# Patient Record
Sex: Female | Born: 1945 | Race: Black or African American | Hispanic: No | Smoking: Former smoker
Health system: Southern US, Community
[De-identification: ages and names within clinical notes are randomized; demographics above are authoritative.]

## PROBLEM LIST (undated history)

## (undated) DIAGNOSIS — K219 Gastro-esophageal reflux disease without esophagitis: Secondary | ICD-10-CM

## (undated) DIAGNOSIS — T7840XA Allergy, unspecified, initial encounter: Secondary | ICD-10-CM

## (undated) DIAGNOSIS — E785 Hyperlipidemia, unspecified: Secondary | ICD-10-CM

## (undated) DIAGNOSIS — I10 Essential (primary) hypertension: Secondary | ICD-10-CM

## (undated) HISTORY — PX: COLONOSCOPY: SHX174

## (undated) HISTORY — DX: Hyperlipidemia, unspecified: E78.5

## (undated) HISTORY — DX: Gastro-esophageal reflux disease without esophagitis: K21.9

## (undated) HISTORY — DX: Allergy, unspecified, initial encounter: T78.40XA

## (undated) HISTORY — PX: ABDOMINAL HYSTERECTOMY: SHX81

---

## 2004-01-16 ENCOUNTER — Emergency Department (HOSPITAL_COMMUNITY): Admission: EM | Admit: 2004-01-16 | Discharge: 2004-01-16 | Payer: Self-pay | Admitting: Emergency Medicine

## 2004-04-15 ENCOUNTER — Ambulatory Visit: Payer: Self-pay | Admitting: Urology

## 2005-05-09 ENCOUNTER — Ambulatory Visit: Payer: Self-pay | Admitting: Family Medicine

## 2005-06-19 ENCOUNTER — Ambulatory Visit: Payer: Self-pay | Admitting: Family Medicine

## 2006-07-08 ENCOUNTER — Ambulatory Visit: Payer: Self-pay | Admitting: Family Medicine

## 2007-07-20 ENCOUNTER — Ambulatory Visit: Payer: Self-pay | Admitting: Family Medicine

## 2007-11-22 ENCOUNTER — Ambulatory Visit: Payer: Self-pay | Admitting: Family Medicine

## 2007-12-01 ENCOUNTER — Ambulatory Visit: Payer: Self-pay | Admitting: Gastroenterology

## 2007-12-02 ENCOUNTER — Ambulatory Visit: Payer: Self-pay | Admitting: Family Medicine

## 2008-04-17 ENCOUNTER — Ambulatory Visit: Payer: Self-pay | Admitting: Family Medicine

## 2009-05-16 ENCOUNTER — Ambulatory Visit: Payer: Self-pay | Admitting: Family Medicine

## 2009-07-09 ENCOUNTER — Ambulatory Visit: Payer: Self-pay | Admitting: Family Medicine

## 2010-11-19 ENCOUNTER — Ambulatory Visit: Payer: Self-pay | Admitting: Family Medicine

## 2011-06-14 ENCOUNTER — Emergency Department: Payer: Self-pay | Admitting: Emergency Medicine

## 2012-06-17 ENCOUNTER — Ambulatory Visit: Payer: Self-pay | Admitting: Family Medicine

## 2013-12-20 ENCOUNTER — Ambulatory Visit: Payer: Self-pay | Admitting: Family Medicine

## 2014-01-25 ENCOUNTER — Ambulatory Visit: Payer: Self-pay | Admitting: Family Medicine

## 2014-08-11 DIAGNOSIS — R0789 Other chest pain: Secondary | ICD-10-CM | POA: Diagnosis not present

## 2014-11-18 ENCOUNTER — Encounter: Payer: Self-pay | Admitting: Emergency Medicine

## 2014-11-18 ENCOUNTER — Emergency Department
Admission: EM | Admit: 2014-11-18 | Discharge: 2014-11-18 | Disposition: A | Discharge status: Home / Self Care | Payer: Medicare Other | Attending: Emergency Medicine | Admitting: Emergency Medicine

## 2014-11-18 DIAGNOSIS — H6503 Acute serous otitis media, bilateral: Secondary | ICD-10-CM

## 2014-11-18 DIAGNOSIS — R42 Dizziness and giddiness: Secondary | ICD-10-CM | POA: Diagnosis not present

## 2014-11-18 DIAGNOSIS — F17200 Nicotine dependence, unspecified, uncomplicated: Secondary | ICD-10-CM | POA: Diagnosis not present

## 2014-11-18 DIAGNOSIS — J018 Other acute sinusitis: Secondary | ICD-10-CM | POA: Diagnosis not present

## 2014-11-18 DIAGNOSIS — I10 Essential (primary) hypertension: Secondary | ICD-10-CM | POA: Insufficient documentation

## 2014-11-18 DIAGNOSIS — Z720 Tobacco use: Secondary | ICD-10-CM | POA: Insufficient documentation

## 2014-11-18 DIAGNOSIS — R51 Headache: Secondary | ICD-10-CM | POA: Diagnosis present

## 2014-11-18 HISTORY — DX: Essential (primary) hypertension: I10

## 2014-11-18 MED ORDER — MECLIZINE HCL 25 MG PO TABS
25.0000 mg | ORAL_TABLET | Freq: Once | ORAL | Status: AC
  Administered 2014-11-18: 25 mg via ORAL

## 2014-11-18 MED ORDER — MECLIZINE HCL 25 MG PO TABS
ORAL_TABLET | ORAL | Status: AC
  Administered 2014-11-18: 25 mg via ORAL
  Filled 2014-11-18: qty 1

## 2014-11-18 MED ORDER — PREDNISONE 20 MG PO TABS
ORAL_TABLET | ORAL | Status: AC
  Administered 2014-11-18: 60 mg via ORAL
  Filled 2014-11-18: qty 3

## 2014-11-18 MED ORDER — MECLIZINE HCL 25 MG PO TABS: 25.0000 mg | ORAL_TABLET | Freq: Three times a day (TID) | ORAL | Status: DC | PRN

## 2014-11-18 MED ORDER — AMOXICILLIN-POT CLAVULANATE 875-125 MG PO TABS: 1.0000 | ORAL_TABLET | Freq: Two times a day (BID) | ORAL | Status: AC

## 2014-11-18 MED ORDER — PREDNISONE 20 MG PO TABS
60.0000 mg | ORAL_TABLET | Freq: Once | ORAL | Status: AC
  Administered 2014-11-18: 60 mg via ORAL

## 2014-11-18 MED ORDER — FEXOFENADINE-PSEUDOEPHED ER 60-120 MG PO TB12: 1.0000 | ORAL_TABLET | Freq: Two times a day (BID) | ORAL | Status: DC

## 2014-11-18 MED ORDER — PREDNISONE 20 MG PO TABS: 40.0000 mg | ORAL_TABLET | Freq: Every day | ORAL | Status: DC

## 2014-11-18 MED ORDER — LORATADINE-PSEUDOEPHEDRINE ER 5-120 MG PO TB12: 1.0000 | ORAL_TABLET | Freq: Two times a day (BID) | ORAL | Status: DC

## 2014-11-18 NOTE — ED Provider Notes (Signed)
[facility] Emergency Department Provider Note    ____________________________________________  Time seen: 1706  I have reviewed the triage vital signs and the nursing notes.   HISTORY  Chief Complaint URI       HPI Donna Welch is a 69 y.o. female and is complaining of sinus pressure pain headachenasal drainage and some dizziness with quick positional changes over the last week symptoms started approximately 2 weeks ago rates her pain as about a 3-5 out of 10 throbbing type head pain when it's on currently rates it as about a 3 no nausea no vomiting no diarrhea no fevers or chills and no other signs or symptoms currently     Past Medical History  Diagnosis Date  . Hypertension     There are no active problems to display for this patient.   No past surgical history on file.  Current Outpatient Rx  Name  Route  Sig  Dispense  Refill  . amoxicillin-clavulanate (AUGMENTIN) 875-125 MG per tablet   Oral   Take 1 tablet by mouth 2 (two) times daily.   14 tablet   0   . loratadine-pseudoephedrine (CLARITIN-D 12 HOUR) 5-120 MG per tablet   Oral   Take 1 tablet by mouth 2 (two) times daily.   14 tablet   2   . meclizine (ANTIVERT) 25 MG tablet   Oral   Take 1 tablet (25 mg total) by mouth 3 (three) times daily as needed for dizziness.   15 tablet   0   . predniSONE (DELTASONE) 20 MG tablet   Oral   Take 2 tablets (40 mg total) by mouth daily.   8 tablet   0     Allergies Vicodin  No family history on file.  Social History History  Substance Use Topics  . Smoking status: Current Every Day Smoker -- 0.50 packs/day  . Smokeless tobacco: Not on file  . Alcohol Use: 8.4 oz/week    14 Standard drinks or equivalent per week    Review of Systems  Constitutional: Negative for fever. Eyes: Negative for visual changes. ENT: Negative for sore throat. Cardiovascular: Negative for chest pain. Respiratory: Negative for  shortness of breath. Gastrointestinal: Negative for abdominal pain, vomiting and diarrhea. Genitourinary: Negative for dysuria. Musculoskeletal: Negative for back pain. Skin: Negative for rash. Neurological: Negative for headaches, focal weakness or numbness.   10-point ROS otherwise negative.  ____________________________________________   PHYSICAL EXAM:  VITAL SIGNS: ED Triage Vitals  Enc Vitals Group     BP 11/18/14 1655 157/77 mmHg     Pulse Rate 11/18/14 1655 52     Resp 11/18/14 1655 18     Temp 11/18/14 1655 98.3 F (36.8 C)     Temp Source 11/18/14 1655 Oral     SpO2 11/18/14 1655 96 %     Weight 11/18/14 1655 164 lb (74.39 kg)     Height 11/18/14 1655 5\' 5"  (1.651 m)     Head Cir --      Peak Flow --      Pain Score 11/18/14 1657 0     Pain Loc --      Pain Edu? --      Excl. in GC? --      Constitutional: Alert and oriented. Well appearing and in no distress. Eyes: Conjunctivae are normal. PERRL. Normal extraocular movements. ENT   Head: Pain with percussion over frontal and maxillary sinuses bilateral serous otitis media   Nose: No congestion/rhinnorhea.  Mouth/Throat: Mucous membranes are moist.   Neck: No stridor. Hematological/Lymphatic/Immunilogical: No cervical lymphadenopathy. Cardiovascular: Normal rate, regular rhythm. Normal and symmetric distal pulses are present in all extremities. No murmurs, rubs, or gallops. Respiratory: Normal respiratory effort without tachypnea nor retractions. Breath sounds are clear and equal bilaterally. No wheezes/rales/rhonchi.  Musculoskeletal: Nontender with normal range of motion in all extremities. No joint effusions.  No lower extremity tenderness nor edema. Neurologic:  Normal speech and language. No gross focal neurologic deficits are appreciated. Speech is normal. No gait instability. Skin:  Skin is warm, dry and intact. No rash noted. Psychiatric: Mood and affect are normal. Speech and behavior  are normal. Patient exhibits appropriate insight and judgment.  ____________________________________________      PROCEDURES  Procedure(s) performed: None  Critical Care performed: No  ____________________________________________   INITIAL IMPRESSION / ASSESSMENT AND PLAN / ED COURSE  Pertinent labs & imaging results that were available during my care of the patient were reviewed by me and considered in my medical decision making (see chart for details).  Sinusitis otitis serous patient has an exam revealing fluid behind both ears pain with percussion over her frontal and maxillary sinuses and nasal drainage or patient on decongestants and antihistamines and have follow-up with ear nose and throat is symptoms persist  ____________________________________________   FINAL CLINICAL IMPRESSION(S) / ED DIAGNOSES  Final diagnoses:  Other acute sinusitis  Bilateral acute serous otitis media, recurrence not specified    Raiden Yearwood Rosalyn GessWilliam C Yehudit Fulginiti, PA-C 11/18/14 1744  Sharman CheekPhillip Stafford, MD 11/18/14 2327

## 2014-11-18 NOTE — ED Notes (Signed)
Voice raspy

## 2014-12-01 DIAGNOSIS — Z716 Tobacco abuse counseling: Secondary | ICD-10-CM | POA: Diagnosis not present

## 2014-12-01 DIAGNOSIS — H6593 Unspecified nonsuppurative otitis media, bilateral: Secondary | ICD-10-CM | POA: Diagnosis not present

## 2014-12-18 ENCOUNTER — Telehealth: Payer: Self-pay | Admitting: Family Medicine

## 2014-12-18 DIAGNOSIS — N3281 Overactive bladder: Secondary | ICD-10-CM

## 2014-12-18 NOTE — Telephone Encounter (Signed)
Sent refill for 2 months to pt pharmacy. Pt needs to schedule follow up appt for remaining refills.

## 2015-01-05 ENCOUNTER — Other Ambulatory Visit: Payer: Self-pay | Admitting: Family Medicine

## 2015-01-08 ENCOUNTER — Encounter: Payer: Self-pay | Admitting: Family Medicine

## 2015-01-08 ENCOUNTER — Ambulatory Visit (INDEPENDENT_AMBULATORY_CARE_PROVIDER_SITE_OTHER): Payer: 59 | Admitting: Family Medicine

## 2015-01-08 ENCOUNTER — Encounter (INDEPENDENT_AMBULATORY_CARE_PROVIDER_SITE_OTHER): Payer: Self-pay

## 2015-01-08 VITALS — BP 122/70 | HR 60 | Temp 98.3°F | Resp 16 | Ht 65.0 in | Wt 166.6 lb

## 2015-01-08 DIAGNOSIS — N3281 Overactive bladder: Secondary | ICD-10-CM

## 2015-01-08 DIAGNOSIS — I10 Essential (primary) hypertension: Secondary | ICD-10-CM

## 2015-01-08 DIAGNOSIS — E785 Hyperlipidemia, unspecified: Secondary | ICD-10-CM

## 2015-01-08 MED ORDER — OXYBUTYNIN CHLORIDE ER 10 MG PO TB24
10.0000 mg | ORAL_TABLET | Freq: Every day | ORAL | Status: AC
Start: 2015-01-08 — End: ?

## 2015-01-08 MED ORDER — OMEPRAZOLE 40 MG PO CPDR: 40.0000 mg | DELAYED_RELEASE_CAPSULE | Freq: Every day | ORAL | Status: DC

## 2015-01-08 MED ORDER — OXYBUTYNIN CHLORIDE ER 10 MG PO TB24
10.0000 mg | ORAL_TABLET | Freq: Every day | ORAL | Status: DC
Start: 2015-01-08 — End: 2015-01-08

## 2015-01-08 MED ORDER — AMLODIPINE BESYLATE 5 MG PO TABS: 5.0000 mg | ORAL_TABLET | Freq: Every day | ORAL | Status: DC

## 2015-01-08 MED ORDER — FEXOFENADINE-PSEUDOEPHED ER 60-120 MG PO TB12: 1.0000 | ORAL_TABLET | Freq: Two times a day (BID) | ORAL | Status: DC

## 2015-01-08 MED ORDER — LOSARTAN POTASSIUM-HCTZ 100-25 MG PO TABS: 1.0000 | ORAL_TABLET | Freq: Every day | ORAL | Status: DC

## 2015-01-08 MED ORDER — OXYBUTYNIN CHLORIDE ER 10 MG PO TB24: 10.0000 mg | ORAL_TABLET | Freq: Every day | ORAL | Status: DC

## 2015-01-08 MED ORDER — OXYBUTYNIN CHLORIDE ER 10 MG PO TB24
10.0000 mg | ORAL_TABLET | Freq: Every day | ORAL | Status: DC
Start: 2015-01-08 — End: 2022-05-10

## 2015-01-08 NOTE — Patient Instructions (Signed)
F/U 4 MO 

## 2015-01-08 NOTE — Telephone Encounter (Signed)
Patient was here for appointment. Refills addressed at that time

## 2015-01-08 NOTE — Progress Notes (Signed)
Name: Donna Welch   MRN: [id number]    DOB: 30-Nov-1945   Date:01/08/2015       Progress Note  Subjective  Chief Complaint  Chief Complaint  Patient presents with  . Hypertension  . Anxiety  . Hyperlipidemia    Hypertension This is a chronic problem. The current episode started more than 1 year ago. The problem is unchanged. The problem is controlled. Associated symptoms include anxiety and palpitations. Pertinent negatives include no blurred vision, chest pain, headaches, neck pain, orthopnea or shortness of breath. There are no associated agents to hypertension. Risk factors for coronary artery disease include dyslipidemia, smoking/tobacco exposure and post-menopausal state. Past treatments include angiotensin blockers and calcium channel blockers. The current treatment provides moderate improvement. There are no compliance problems.   Anxiety Presents for follow-up visit. Symptoms include excessive worry, hyperventilation, insomnia, irritability, nervous/anxious behavior and palpitations. Patient reports no chest pain, dizziness, nausea or shortness of breath. Symptoms occur most days. The severity of symptoms is moderate. The symptoms are aggravated by work stress and family issues. The quality of sleep is good.   Her past medical history is significant for anxiety/panic attacks. There is no history of suicide attempts. Past treatments include SSRIs. The treatment provided moderate relief. Compliance with prior treatments has been good.  Hyperlipidemia This is a chronic problem. The current episode started more than 1 year ago. The problem is uncontrolled. Recent lipid tests were reviewed and are high. Factors aggravating her hyperlipidemia include fatty foods. Pertinent negatives include no chest pain, focal weakness, myalgias or shortness of breath. Current antihyperlipidemic treatment includes diet change. The current treatment provides mild improvement of lipids. Compliance  problems include adherence to diet.  Risk factors for coronary artery disease include dyslipidemia, hypertension, a sedentary lifestyle and stress.      Past Medical History  Diagnosis Date  . Hypertension   . GERD (gastroesophageal reflux disease)   . Hyperlipidemia   . Allergy     History  Substance Use Topics  . Smoking status: Current Some Day Smoker -- 0.50 packs/day  . Smokeless tobacco: Not on file     Comment: quit for a year then started back after mother died.  . Alcohol Use: 8.4 oz/week    14 Standard drinks or equivalent per week     Current outpatient prescriptions:  .  amLODipine (NORVASC) 5 MG tablet, Take 5 mg by mouth daily., Disp: , Rfl:  .  fexofenadine-pseudoephedrine (ALLEGRA-D) 60-120 MG per tablet, Take 1 tablet by mouth 2 (two) times daily., Disp: 14 tablet, Rfl: 1 .  fluticasone (FLONASE) 50 MCG/ACT nasal spray, Place into both nostrils daily., Disp: , Rfl:  .  losartan-hydrochlorothiazide (HYZAAR) 100-25 MG per tablet, Take 1 tablet by mouth daily., Disp: , Rfl:  .  omeprazole (PRILOSEC) 40 MG capsule, Take 40 mg by mouth daily., Disp: , Rfl:  .  oxybutynin (DITROPAN-XL) 10 MG 24 hr tablet, TAKE 1 TABLET BY MOUTH EVERY DAY, Disp: 60 tablet, Rfl: 0  Allergies  Allergen Reactions  . Vicodin [Hydrocodone-Acetaminophen] Other (See Comments)    hyperactivity    Review of Systems  Constitutional: Positive for irritability. Negative for fever, chills and weight loss.  HENT: Negative for congestion, hearing loss, sore throat and tinnitus.   Eyes: Negative for blurred vision, double vision and redness.  Respiratory: Negative for cough, hemoptysis and shortness of breath.   Cardiovascular: Positive for palpitations. Negative for chest pain, orthopnea, claudication and leg swelling.  Gastrointestinal: Negative for heartburn, nausea,  vomiting, diarrhea, constipation and blood in stool.  Genitourinary: Negative for dysuria, urgency, frequency and hematuria.   Musculoskeletal: Negative for myalgias, back pain, joint pain, falls and neck pain.  Skin: Negative for itching.  Neurological: Negative for dizziness, tingling, tremors, focal weakness, seizures, loss of consciousness, weakness and headaches.  Endo/Heme/Allergies: Does not bruise/bleed easily.  Psychiatric/Behavioral: Negative for depression and substance abuse. The patient is nervous/anxious and has insomnia.      Objective  Filed Vitals:   01/08/15 0755  BP: 122/70  Pulse: 60  Temp: 98.3 F (36.8 C)  TempSrc: Oral  Resp: 16  Height: 5\' 5"  (1.651 m)  Weight: 166 lb 9.6 oz (75.569 kg)  SpO2: 94%     Physical Exam  Constitutional: She is oriented to person, place, and time and well-developed, well-nourished, and in no distress.  HENT:  Head: Normocephalic.  Eyes: EOM are normal. Pupils are equal, round, and reactive to light.  Neck: Normal range of motion. No thyromegaly present.  Cardiovascular: Normal rate, regular rhythm and normal heart sounds.   No murmur heard. Pulmonary/Chest: Effort normal and breath sounds normal.  Abdominal: Soft. Bowel sounds are normal.  Musculoskeletal: Normal range of motion. She exhibits no edema.  Neurological: She is alert and oriented to person, place, and time. No cranial nerve deficit. Gait normal.  Skin: Skin is warm and dry. No rash noted.  Psychiatric: Memory normal.  Anxious      Assessment & Plan  1. Overactive bladder Stable well-controlled - oxybutynin (DITROPAN-XL) 10 MG 24 hr tablet; Take 1 tablet (10 mg total) by mouth daily.  Dispense: 60 tablet; Refill: 0 - oxybutynin (DITROPAN-XL) 10 MG 24 hr tablet; Take 1 tablet (10 mg total) by mouth daily.  Dispense: 30 tablet; Refill: 5  2. Essential hypertension Well-controlled - Comprehensive metabolic panel   3. Hyperlipidemia Recheck lipid panel - TSH - Lipid panel

## 2015-01-26 DIAGNOSIS — E785 Hyperlipidemia, unspecified: Secondary | ICD-10-CM | POA: Diagnosis not present

## 2015-01-26 DIAGNOSIS — I10 Essential (primary) hypertension: Secondary | ICD-10-CM | POA: Diagnosis not present

## 2015-01-27 LAB — LIPID PANEL
CHOL/HDL RATIO: 5.5 ratio — AB (ref 0.0–4.4)
CHOLESTEROL TOTAL: 232 mg/dL — AB (ref 100–199)
HDL: 42 mg/dL (ref >39)
LDL CALC: 163 mg/dL — AB (ref 0–99)
Triglycerides: 137 mg/dL (ref 0–149)
VLDL CHOLESTEROL CAL: 27 mg/dL (ref 5–40)

## 2015-01-27 LAB — COMPREHENSIVE METABOLIC PANEL
ALBUMIN: 4.7 g/dL (ref 3.6–4.8)
ALK PHOS: 95 IU/L (ref 39–117)
ALT: 20 IU/L (ref 0–32)
AST: 22 IU/L (ref 0–40)
Albumin/Globulin Ratio: 2.2 (ref 1.1–2.5)
BILIRUBIN TOTAL: 0.4 mg/dL (ref 0.0–1.2)
BUN / CREAT RATIO: 14 (ref 11–26)
BUN: 13 mg/dL (ref 8–27)
CHLORIDE: 98 mmol/L (ref 97–108)
CO2: 25 mmol/L (ref 18–29)
Calcium: 9.5 mg/dL (ref 8.7–10.3)
Creatinine, Ser: 0.92 mg/dL (ref 0.57–1.00)
GFR calc non Af Amer: 65 mL/min/{1.73_m2} (ref >59)
GFR, EST AFRICAN AMERICAN: 75 mL/min/{1.73_m2} (ref >59)
GLOBULIN, TOTAL: 2.1 g/dL (ref 1.5–4.5)
Glucose: 111 mg/dL — ABNORMAL HIGH (ref 65–99)
Potassium: 4.1 mmol/L (ref 3.5–5.2)
SODIUM: 141 mmol/L (ref 134–144)
Total Protein: 6.8 g/dL (ref 6.0–8.5)

## 2015-01-27 LAB — TSH: TSH: 2.8 u[IU]/mL (ref 0.450–4.500)

## 2015-01-29 DIAGNOSIS — H16223 Keratoconjunctivitis sicca, not specified as Sjogren's, bilateral: Secondary | ICD-10-CM | POA: Diagnosis not present

## 2015-03-14 ENCOUNTER — Other Ambulatory Visit: Payer: Self-pay | Admitting: Family Medicine

## 2015-06-19 ENCOUNTER — Encounter: Payer: Self-pay | Admitting: Emergency Medicine

## 2015-06-19 ENCOUNTER — Ambulatory Visit
Admission: EM | Admit: 2015-06-19 | Discharge: 2015-06-19 | Disposition: A | Discharge status: Home / Self Care | Payer: Medicare Other | Attending: Family Medicine | Admitting: Family Medicine

## 2015-06-19 DIAGNOSIS — H65193 Other acute nonsuppurative otitis media, bilateral: Secondary | ICD-10-CM

## 2015-06-19 MED ORDER — AMOXICILLIN 875 MG PO TABS: 875.0000 mg | ORAL_TABLET | Freq: Two times a day (BID) | ORAL | Status: DC

## 2015-06-19 MED ORDER — FLUCONAZOLE 150 MG PO TABS: 150.0000 mg | ORAL_TABLET | Freq: Every day | ORAL | Status: DC

## 2015-06-19 NOTE — ED Provider Notes (Signed)
CSN: [id number]     Arrival date & time 06/19/15  1416 History   First MD Initiated Contact with Patient 06/19/15 1611     Chief Complaint  Patient presents with  . Sore Throat  . Nasal Congestion  . Cough   (Consider location/radiation/quality/duration/timing/severity/associated sxs/prior Treatment) Patient is a 69 y.o. female presenting with URI. The history is provided by the patient.  URI Presenting symptoms: congestion, cough, ear pain, fever and sore throat   Severity:  Moderate Onset quality:  Sudden Duration:  7 days Timing:  Constant Progression:  Worsening Chronicity:  New Relieved by:  Nothing   Past Medical History  Diagnosis Date  . Hypertension   . GERD (gastroesophageal reflux disease)   . Hyperlipidemia   . Allergy    Past Surgical History  Procedure Laterality Date  . Abdominal hysterectomy     Family History  Problem Relation Age of Onset  . Diabetes Mother   . Hypertension Mother    Social History  Substance Use Topics  . Smoking status: Current Some Day Smoker -- 0.50 packs/day  . Smokeless tobacco: None     Comment: quit for a year then started back after mother died.  . Alcohol Use: 8.4 oz/week    14 Standard drinks or equivalent per week   OB History    No data available     Review of Systems  Constitutional: Positive for fever.  HENT: Positive for congestion, ear pain and sore throat.   Respiratory: Positive for cough.     Allergies  Vicodin  Home Medications   Prior to Admission medications   Medication Sig Start Date End Date Taking? Authorizing Provider  amLODipine (NORVASC) 5 MG tablet TAKE 1 TABLET BY MOUTH DAILY 01/08/15   Dennison MascotLemont Morrisey, MD  amLODipine (NORVASC) 5 MG tablet Take 1 tablet (5 mg total) by mouth daily. 01/08/15   Dennison MascotLemont Morrisey, MD  amoxicillin (AMOXIL) 875 MG tablet Take 1 tablet (875 mg total) by mouth 2 (two) times daily. 06/19/15   Payton Mccallumrlando Syliva Mee, MD  fexofenadine-pseudoephedrine (ALLEGRA-D) 60-120 MG per  tablet Take 1 tablet by mouth 2 (two) times daily. 01/08/15   Dennison MascotLemont Morrisey, MD  fluconazole (DIFLUCAN) 150 MG tablet Take 1 tablet (150 mg total) by mouth daily. 06/19/15   Payton Mccallumrlando Ronson Hagins, MD  fluticasone (FLONASE) 50 MCG/ACT nasal spray USE ONE SPRAY IN EACH NOSTRIL TWICE DAILY. 03/14/15   Dennison MascotLemont Morrisey, MD  losartan-hydrochlorothiazide (HYZAAR) 100-25 MG per tablet Take 1 tablet by mouth daily. 01/08/15   Dennison MascotLemont Morrisey, MD  omeprazole (PRILOSEC) 40 MG capsule Take 1 capsule (40 mg total) by mouth daily. 01/08/15   Dennison MascotLemont Morrisey, MD  oxybutynin (DITROPAN-XL) 10 MG 24 hr tablet Take 1 tablet (10 mg total) by mouth daily. 01/08/15   Dennison MascotLemont Morrisey, MD  oxybutynin (DITROPAN-XL) 10 MG 24 hr tablet Take 1 tablet (10 mg total) by mouth daily. 01/08/15   Dennison MascotLemont Morrisey, MD   Meds Ordered and Administered this Visit  Medications - No data to display  BP 144/78 mmHg  Pulse 62  Temp(Src) 97 F (36.1 C) (Tympanic)  Resp 16  Ht 5\' 5"  (1.651 m)  SpO2 99% No data found.   Physical Exam  Constitutional: She appears well-developed and well-nourished. No distress.  HENT:  Head: Normocephalic and atraumatic.  Right Ear: Tympanic membrane, external ear and ear canal normal.  Left Ear: Tympanic membrane, external ear and ear canal normal.  Nose: Mucosal edema and rhinorrhea present. No nose lacerations, sinus tenderness, nasal  deformity, septal deviation or nasal septal hematoma. No epistaxis.  No foreign bodies. Right sinus exhibits maxillary sinus tenderness and frontal sinus tenderness. Left sinus exhibits maxillary sinus tenderness and frontal sinus tenderness.  Mouth/Throat: Uvula is midline, oropharynx is clear and moist and mucous membranes are normal. No oropharyngeal exudate.  Eyes: Conjunctivae and EOM are normal. Pupils are equal, round, and reactive to light. Right eye exhibits no discharge. Left eye exhibits no discharge. No scleral icterus.  Neck: Normal range of motion. Neck supple. No  thyromegaly present.  Cardiovascular: Normal rate, regular rhythm and normal heart sounds.   Pulmonary/Chest: Effort normal and breath sounds normal. No respiratory distress. She has no wheezes. She has no rales.  Lymphadenopathy:    She has no cervical adenopathy.  Skin: She is not diaphoretic.  Nursing note and vitals reviewed.   ED Course  Procedures (including critical care time)  Labs Review Labs Reviewed - No data to display  Imaging Review No results found.   Visual Acuity Review  Right Eye Distance:   Left Eye Distance:   Bilateral Distance:    Right Eye Near:   Left Eye Near:    Bilateral Near:         MDM   1. Acute nonsuppurative otitis media of both ears    Discharge Medication List as of 06/19/2015  4:20 PM    START taking these medications   Details  amoxicillin (AMOXIL) 875 MG tablet Take 1 tablet (875 mg total) by mouth 2 (two) times daily., Starting 06/19/2015, Until Discontinued, Normal    fluconazole (DIFLUCAN) 150 MG tablet Take 1 tablet (150 mg total) by mouth daily., Starting 06/19/2015, Until Discontinued, Normal       1.diagnosis reviewed with patient 2. rx as per orders above; reviewed possible side effects, interactions, risks and benefits  3. Recommend supportive treatment with otc analgesics prn 4. Follow-up prn if symptoms worsen or don't improve    Payton Mccallum, MD 06/19/15 [phone]

## 2015-06-19 NOTE — ED Notes (Signed)
Patient c/o sore throat that started yesterday.  Patient reports cough for a week.

## 2015-07-11 ENCOUNTER — Telehealth: Payer: Self-pay | Admitting: Family Medicine

## 2015-07-11 MED ORDER — AMLODIPINE BESYLATE 5 MG PO TABS: 5.0000 mg | ORAL_TABLET | Freq: Every day | ORAL | Status: DC

## 2015-07-11 NOTE — Telephone Encounter (Signed)
Pt [facility] [place]

## 2015-07-11 NOTE — Telephone Encounter (Signed)
Script for 30 day supply sent. Patient  notified to make appointment

## 2015-07-12 ENCOUNTER — Other Ambulatory Visit: Payer: Self-pay | Admitting: Family Medicine

## 2015-07-31 ENCOUNTER — Other Ambulatory Visit: Payer: Self-pay | Admitting: Family Medicine

## 2015-08-02 DIAGNOSIS — K219 Gastro-esophageal reflux disease without esophagitis: Secondary | ICD-10-CM | POA: Insufficient documentation

## 2015-08-02 DIAGNOSIS — N393 Stress incontinence (female) (male): Secondary | ICD-10-CM | POA: Insufficient documentation

## 2015-08-02 DIAGNOSIS — Z720 Tobacco use: Secondary | ICD-10-CM | POA: Insufficient documentation

## 2015-08-02 DIAGNOSIS — I10 Essential (primary) hypertension: Secondary | ICD-10-CM | POA: Insufficient documentation

## 2015-09-10 ENCOUNTER — Ambulatory Visit
Admission: RE | Admit: 2015-09-10 | Discharge: 2015-09-10 | Disposition: A | Discharge status: Home / Self Care | Payer: Medicare Other | Source: Ambulatory Visit | Attending: Internal Medicine | Admitting: Internal Medicine

## 2015-09-10 ENCOUNTER — Other Ambulatory Visit: Payer: Self-pay | Admitting: Internal Medicine

## 2015-09-10 DIAGNOSIS — Z1231 Encounter for screening mammogram for malignant neoplasm of breast: Secondary | ICD-10-CM | POA: Diagnosis not present

## 2016-01-30 DIAGNOSIS — E785 Hyperlipidemia, unspecified: Secondary | ICD-10-CM | POA: Insufficient documentation

## 2016-04-01 ENCOUNTER — Other Ambulatory Visit: Payer: Self-pay | Admitting: Family Medicine

## 2016-09-26 ENCOUNTER — Other Ambulatory Visit: Payer: Self-pay | Admitting: Internal Medicine

## 2016-09-26 ENCOUNTER — Ambulatory Visit
Admission: RE | Admit: 2016-09-26 | Discharge: 2016-09-26 | Disposition: A | Discharge status: Home / Self Care | Payer: 59 | Source: Ambulatory Visit | Attending: Internal Medicine | Admitting: Internal Medicine

## 2016-09-26 DIAGNOSIS — Z1231 Encounter for screening mammogram for malignant neoplasm of breast: Secondary | ICD-10-CM | POA: Insufficient documentation

## 2017-01-19 ENCOUNTER — Other Ambulatory Visit: Payer: Self-pay | Admitting: Internal Medicine

## 2017-01-19 DIAGNOSIS — R1030 Lower abdominal pain, unspecified: Secondary | ICD-10-CM

## 2017-01-28 ENCOUNTER — Ambulatory Visit
Admission: RE | Admit: 2017-01-28 | Discharge: 2017-01-28 | Disposition: A | Discharge status: Home / Self Care | Payer: Medicare Other | Source: Ambulatory Visit | Attending: Internal Medicine | Admitting: Internal Medicine

## 2017-01-28 DIAGNOSIS — R1030 Lower abdominal pain, unspecified: Secondary | ICD-10-CM | POA: Diagnosis present

## 2017-01-28 DIAGNOSIS — M5136 Other intervertebral disc degeneration, lumbar region: Secondary | ICD-10-CM | POA: Insufficient documentation

## 2017-01-28 DIAGNOSIS — I700 Atherosclerosis of aorta: Secondary | ICD-10-CM | POA: Diagnosis not present

## 2017-01-28 DIAGNOSIS — K4090 Unilateral inguinal hernia, without obstruction or gangrene, not specified as recurrent: Secondary | ICD-10-CM | POA: Insufficient documentation

## 2017-01-28 DIAGNOSIS — Z9089 Acquired absence of other organs: Secondary | ICD-10-CM | POA: Insufficient documentation

## 2017-01-28 DIAGNOSIS — K760 Fatty (change of) liver, not elsewhere classified: Secondary | ICD-10-CM | POA: Insufficient documentation

## 2017-01-28 DIAGNOSIS — Z90710 Acquired absence of both cervix and uterus: Secondary | ICD-10-CM | POA: Insufficient documentation

## 2017-01-28 LAB — POCT I-STAT CREATININE: Creatinine, Ser: 0.9 mg/dL (ref 0.44–1.00)

## 2017-01-28 MED ORDER — IOPAMIDOL (ISOVUE-300) INJECTION 61%
100.0000 mL | Freq: Once | INTRAVENOUS | Status: AC | PRN
  Administered 2017-01-28: 100 mL via INTRAVENOUS

## 2017-02-23 DIAGNOSIS — R768 Other specified abnormal immunological findings in serum: Secondary | ICD-10-CM | POA: Insufficient documentation

## 2017-02-23 DIAGNOSIS — R634 Abnormal weight loss: Secondary | ICD-10-CM | POA: Insufficient documentation

## 2017-02-23 DIAGNOSIS — M810 Age-related osteoporosis without current pathological fracture: Secondary | ICD-10-CM | POA: Insufficient documentation

## 2017-03-05 ENCOUNTER — Emergency Department: Payer: Medicare Other

## 2017-03-05 ENCOUNTER — Encounter: Payer: Self-pay | Admitting: Emergency Medicine

## 2017-03-05 ENCOUNTER — Emergency Department
Admission: EM | Admit: 2017-03-05 | Discharge: 2017-03-05 | Disposition: A | Discharge status: Home / Self Care | Payer: Medicare Other | Attending: Emergency Medicine | Admitting: Emergency Medicine

## 2017-03-05 DIAGNOSIS — M25512 Pain in left shoulder: Secondary | ICD-10-CM | POA: Diagnosis not present

## 2017-03-05 DIAGNOSIS — I10 Essential (primary) hypertension: Secondary | ICD-10-CM | POA: Diagnosis not present

## 2017-03-05 DIAGNOSIS — Z79899 Other long term (current) drug therapy: Secondary | ICD-10-CM | POA: Insufficient documentation

## 2017-03-05 DIAGNOSIS — G8929 Other chronic pain: Secondary | ICD-10-CM | POA: Diagnosis not present

## 2017-03-05 DIAGNOSIS — F17200 Nicotine dependence, unspecified, uncomplicated: Secondary | ICD-10-CM | POA: Insufficient documentation

## 2017-03-05 DIAGNOSIS — R0789 Other chest pain: Secondary | ICD-10-CM

## 2017-03-05 LAB — BASIC METABOLIC PANEL
Anion gap: 7 (ref 5–15)
BUN: 13 mg/dL (ref 6–20)
CHLORIDE: 105 mmol/L (ref 101–111)
CO2: 28 mmol/L (ref 22–32)
CREATININE: 0.82 mg/dL (ref 0.44–1.00)
Calcium: 9.2 mg/dL (ref 8.9–10.3)
GFR calc Af Amer: >60 mL/min (ref >60)
GFR calc non Af Amer: >60 mL/min (ref >60)
GLUCOSE: 126 mg/dL — AB (ref 65–99)
Potassium: 3.2 mmol/L — ABNORMAL LOW (ref 3.5–5.1)
Sodium: 140 mmol/L (ref 135–145)

## 2017-03-05 LAB — CBC
HCT: 38.6 % (ref 35.0–47.0)
Hemoglobin: 13.4 g/dL (ref 12.0–16.0)
MCH: 34.5 pg — AB (ref 26.0–34.0)
MCHC: 34.9 g/dL (ref 32.0–36.0)
MCV: 98.9 fL (ref 80.0–100.0)
Platelets: 213 10*3/uL (ref 150–440)
RBC: 3.9 MIL/uL (ref 3.80–5.20)
RDW: 12.5 % (ref 11.5–14.5)
WBC: 10 10*3/uL (ref 3.6–11.0)

## 2017-03-05 LAB — TROPONIN I: Troponin I: <0.03 ng/mL (ref <0.03)

## 2017-03-05 NOTE — Discharge Instructions (Signed)
We believe that your symptoms are caused by musculoskeletal strain and/or arthritis.  Your workup is reassuring today and does not suggest a cardiac cause.  Please read through the included information about additional care such as heating pads, over-the-counter pain medicine.  If you were provided a prescription please use it only as needed and as instructed.  Remember that early mobility and using the affected part of your body is actually better than keeping it immobile.  Follow-up with the doctor listed as recommended or return to the emergency department with new or worsening symptoms that concern you.

## 2017-03-05 NOTE — ED Triage Notes (Signed)
Pt with left shoulder pain that started 1.5 weeks ago and has moved into left chest and left arm. Denies SOB.

## 2017-03-05 NOTE — ED Notes (Signed)
Dr. Forbach in room to assess patient.  Will continue to monitor.   

## 2017-03-05 NOTE — ED Provider Notes (Addendum)
[facility] Emergency Department Provider Note  ____________________________________________   First MD Initiated Contact with Patient 03/05/17 1732     (approximate)  I have reviewed the triage vital signs and the nursing notes.   HISTORY  Chief Complaint Chest Pain    HPI Donna Welch is a 71 y.o. female with medical history as listed below and presents for evaluation of gradually worsening left shoulder pain over the last 2 weeks.  She was sent from urgent care for further evaluation given the concern this may represent ACS.  She states that the pain has been going on her left shoulder for about 2 weeks.  Nothing in particular makes the patient's symptoms better nor worse.  It is moderate in severity.  She has seen her regular doctor and rheumatologist and the thought is that it is osteoarthritis or rheumatoid arthritis.  However over the last week it has gradually been radiating down her left arm and into her left-sided chest wall, so she wanted some reassurance that it is not her heart and that she is not having a heart attack.  She went to the urgent care and they sent her here.  She denies any other chest pain, shortness of breath, nausea, vomiting, abdominal pain, fever/chills, dysuria.  She states that she cannot reproduce the pain with moving her shoulder around or pressing on her chest wall, it is just always there and feels like a dull aching pain.  She smokes and has high blood pressure and formally had hyperlipidemia but directed it with diet and exercise.  She does not have diabetes.  She has a strong family history of ACS/heart problems.  She has no personal history of cardiac disease.  She is scheduled for a cardiology appointment with Dr. Gwen Pounds in 1-2 months, she cannot remember exactly when.    Past Medical History:  Diagnosis Date  . Allergy   . GERD (gastroesophageal reflux disease)   . Hyperlipidemia   . Hypertension      There are no active problems to display for this patient.   Past Surgical History:  Procedure Laterality Date  . ABDOMINAL HYSTERECTOMY      Prior to Admission medications   Medication Sig Start Date End Date Taking? Authorizing Provider  amLODipine (NORVASC) 5 MG tablet TAKE 1 TABLET BY MOUTH EVERY DAY 07/12/15   Dennison Mascot, MD  amoxicillin (AMOXIL) 875 MG tablet Take 1 tablet (875 mg total) by mouth 2 (two) times daily. 06/19/15   Payton Mccallum, MD  fexofenadine-pseudoephedrine (ALLEGRA-D) 60-120 MG per tablet Take 1 tablet by mouth 2 (two) times daily. 01/08/15   Dennison Mascot, MD  fluconazole (DIFLUCAN) 150 MG tablet Take 1 tablet (150 mg total) by mouth daily. 06/19/15   Payton Mccallum, MD  fluticasone (FLONASE) 50 MCG/ACT nasal spray USE ONE SPRAY IN EACH NOSTRIL TWICE DAILY. 03/14/15   Dennison Mascot, MD  losartan-hydrochlorothiazide (HYZAAR) 100-25 MG per tablet Take 1 tablet by mouth daily. 01/08/15   Dennison Mascot, MD  omeprazole (PRILOSEC) 40 MG capsule TAKE 1 CAPSULE BY MOUTH EVERY DAY 07/31/15   Dennison Mascot, MD  oxybutynin (DITROPAN-XL) 10 MG 24 hr tablet Take 1 tablet (10 mg total) by mouth daily. 01/08/15   Dennison Mascot, MD  oxybutynin (DITROPAN-XL) 10 MG 24 hr tablet Take 1 tablet (10 mg total) by mouth daily. 01/08/15   Dennison Mascot, MD    Allergies Vicodin [hydrocodone-acetaminophen]  Family History  Problem Relation Age of Onset  . Diabetes Mother   .  Hypertension Mother   . Breast cancer Neg Hx     Social History Social History  Substance Use Topics  . Smoking status: Current Some Day Smoker    Packs/day: 0.50  . Smokeless tobacco: Not on file     Comment: quit for a year then started back after mother died.  . Alcohol use 8.4 oz/week    14 Standard drinks or equivalent per week    Review of Systems Constitutional: No fever/chills Eyes: No visual changes. ENT: No sore throat. Cardiovascular: Left shoulder pain that radiates  into the left side of her chest wall Respiratory: Denies shortness of breath. Gastrointestinal: No abdominal pain.  No nausea, no vomiting.  No diarrhea.  No constipation. Genitourinary: Negative for dysuria. Musculoskeletal: Left shoulder pain, gradually worsening over 2 weeks.  Negative for neck pain.  Negative for back pain. Integumentary: Negative for rash. Neurological: Negative for headaches, focal weakness or numbness.   ____________________________________________   PHYSICAL EXAM:  VITAL SIGNS: ED Triage Vitals  Enc Vitals Group     BP 03/05/17 1637 (!) 156/83     Pulse Rate 03/05/17 1637 62     Resp 03/05/17 1637 20     Temp 03/05/17 1637 98.2 F (36.8 C)     Temp Source 03/05/17 1637 Oral     SpO2 03/05/17 1637 97 %     Weight 03/05/17 1639 70.3 kg (155 lb)     Height 03/05/17 1639 1.651 m (5\' 5" )     Head Circumference --      Peak Flow --      Pain Score 03/05/17 1644 6     Pain Loc --      Pain Edu? --      Excl. in GC? --     Constitutional: Alert and oriented. Well appearing and in no acute distress. Eyes: Conjunctivae are normal.  Head: Atraumatic. Nose: No congestion/rhinnorhea. Mouth/Throat: Mucous membranes are moist. Neck: No stridor.  No meningeal signs.   Cardiovascular: Normal rate, regular rhythm. Good peripheral circulation. Grossly normal heart sounds. Left-sided chest wall pain is not reproducible Respiratory: Normal respiratory effort.  No retractions. Lungs CTAB. Gastrointestinal: Soft and nontender. No distention.  Musculoskeletal: No lower extremity tenderness nor edema. No gross deformities of extremities.  Full range of motion of left shoulder without any reproducible pain or tenderness Neurologic:  Normal speech and language. No gross focal neurologic deficits are appreciated.  Skin:  Skin is warm, dry and intact. No rash noted. Psychiatric: Mood and affect are normal. Speech and behavior are  normal.  ____________________________________________   LABS (all labs ordered are listed, but only abnormal results are displayed)  Labs Reviewed  BASIC METABOLIC PANEL - Abnormal; Notable for the following:       Result Value   Potassium 3.2 (*)    Glucose, Bld 126 (*)    All other components within normal limits  CBC - Abnormal; Notable for the following:    MCH 34.5 (*)    All other components within normal limits  TROPONIN I   ____________________________________________  EKG  ED ECG REPORT I, Domenick Quebedeaux, the attending physician, personally viewed and interpreted this ECG.  Date: 03/05/2017 EKG Time: 16:35 Rate: 62 Rhythm: normal sinus rhythm QRS Axis: normal Intervals: normal ST/T Wave abnormalities: normal Narrative Interpretation: unremarkable  ____________________________________________  RADIOLOGY   Dg Chest 2 View  Result Date: 03/05/2017 CLINICAL DATA:  Left-sided chest pain with left arm pain for 1 week. EXAM: CHEST  2 VIEW  COMPARISON:  07/09/2009 FINDINGS: The heart size and mediastinal contours are within normal limits. Both lungs are clear. The visualized skeletal structures are unremarkable. IMPRESSION: No active cardiopulmonary disease. Electronically Signed   By: Kennith Center M.D.   On: 03/05/2017 17:33    ____________________________________________   PROCEDURES  Critical Care performed: No   Procedure(s) performed:   Procedures   ____________________________________________   INITIAL IMPRESSION / ASSESSMENT AND PLAN / ED COURSE  Pertinent labs & imaging results that were available during my care of the patient were reviewed by me and considered in my medical decision making (see chart for details).  The patient is low risk for ACS based on HEART score.  Wells Score for PE is zero.  Her main complaint is shoulder pain, and she only became concerned over the last week when it is been radiating further from her shoulder.  Even  though it is not reproducible with range of motion or palpation, I strongly do believe that this is more musculoskeletal or arthritis related.  I do not believe this represents an acute or emergent condition and the patient is reassured by her normal EKG, chest x-ray, and lab results.  She was to follow with her regular doctor which I think is appropriate.  No benefit to repeat troponin at this time.  Of note, patient reports that she cannot take aspirin, so I did not order a dose for her today (states she develops easy bruising, "bloodshot eyes", and other adverse reactions).      ____________________________________________  FINAL CLINICAL IMPRESSION(S) / ED DIAGNOSES  Final diagnoses:  Chronic left shoulder pain  Atypical chest pain     MEDICATIONS GIVEN DURING THIS VISIT:  Medications - No data to display   NEW OUTPATIENT MEDICATIONS STARTED DURING THIS VISIT:  New Prescriptions   No medications on file    Modified Medications   No medications on file    Discontinued Medications   No medications on file     Note:  This document was prepared using Dragon voice recognition software and may include unintentional dictation errors.    Loleta Rose, MD 03/05/17 Sallyanne Havers    Loleta Rose, MD 03/05/17 [phone]

## 2017-03-05 NOTE — ED Notes (Signed)
Patient transported to X-ray 

## 2017-03-05 NOTE — ED Notes (Signed)
Patient brought back from x-ray.  Nurse at bedside.

## 2017-03-05 NOTE — ED Notes (Signed)
Patient reports left shoulder pain for several weeks that is now, in the past week, radiating into her neck, chest and arm.  Patient is in no obvious distress at this time.  Patient has full range of motion of her left arm.  No obvious distress at this time.

## 2017-03-05 NOTE — ED Notes (Signed)
First nurse note: Pt brought over from [facility] for reports of left side chest and shoulder pain that began yesterday. Pt texting on phone, respirations even and nonlabored, no apparent distress noted.

## 2017-03-10 DIAGNOSIS — M25512 Pain in left shoulder: Secondary | ICD-10-CM | POA: Insufficient documentation

## 2018-01-11 ENCOUNTER — Other Ambulatory Visit: Payer: Self-pay | Admitting: Internal Medicine

## 2018-01-11 DIAGNOSIS — Z1231 Encounter for screening mammogram for malignant neoplasm of breast: Secondary | ICD-10-CM

## 2018-01-28 ENCOUNTER — Ambulatory Visit
Admission: RE | Admit: 2018-01-28 | Discharge: 2018-01-28 | Disposition: A | Discharge status: Home / Self Care | Payer: Medicare Other | Source: Ambulatory Visit | Attending: Internal Medicine | Admitting: Internal Medicine

## 2018-01-28 DIAGNOSIS — Z1231 Encounter for screening mammogram for malignant neoplasm of breast: Secondary | ICD-10-CM | POA: Insufficient documentation

## 2019-01-18 DIAGNOSIS — R0602 Shortness of breath: Secondary | ICD-10-CM | POA: Insufficient documentation

## 2019-02-09 DIAGNOSIS — I70219 Atherosclerosis of native arteries of extremities with intermittent claudication, unspecified extremity: Secondary | ICD-10-CM | POA: Insufficient documentation

## 2019-02-09 DIAGNOSIS — R001 Bradycardia, unspecified: Secondary | ICD-10-CM | POA: Insufficient documentation

## 2019-02-09 DIAGNOSIS — I700 Atherosclerosis of aorta: Secondary | ICD-10-CM | POA: Insufficient documentation

## 2019-03-07 ENCOUNTER — Other Ambulatory Visit: Payer: Self-pay | Admitting: Internal Medicine

## 2019-03-07 DIAGNOSIS — Z1231 Encounter for screening mammogram for malignant neoplasm of breast: Secondary | ICD-10-CM

## 2019-03-09 ENCOUNTER — Ambulatory Visit
Admission: RE | Admit: 2019-03-09 | Discharge: 2019-03-09 | Disposition: A | Discharge status: Home / Self Care | Payer: Medicare Other | Source: Ambulatory Visit | Attending: Internal Medicine | Admitting: Internal Medicine

## 2019-03-09 DIAGNOSIS — Z1231 Encounter for screening mammogram for malignant neoplasm of breast: Secondary | ICD-10-CM | POA: Diagnosis present

## 2019-03-31 ENCOUNTER — Other Ambulatory Visit: Payer: Self-pay | Admitting: Internal Medicine

## 2019-03-31 DIAGNOSIS — M4807 Spinal stenosis, lumbosacral region: Secondary | ICD-10-CM

## 2019-04-13 ENCOUNTER — Other Ambulatory Visit: Payer: Self-pay

## 2019-04-13 ENCOUNTER — Ambulatory Visit
Admission: RE | Admit: 2019-04-13 | Discharge: 2019-04-13 | Disposition: A | Discharge status: Home / Self Care | Payer: Medicare Other | Source: Ambulatory Visit | Attending: Internal Medicine | Admitting: Internal Medicine

## 2019-04-13 DIAGNOSIS — M4807 Spinal stenosis, lumbosacral region: Secondary | ICD-10-CM | POA: Diagnosis present

## 2019-05-02 ENCOUNTER — Encounter (INDEPENDENT_AMBULATORY_CARE_PROVIDER_SITE_OTHER): Payer: Medicare Other | Admitting: Vascular Surgery

## 2019-05-09 ENCOUNTER — Encounter (INDEPENDENT_AMBULATORY_CARE_PROVIDER_SITE_OTHER): Payer: Self-pay | Admitting: Vascular Surgery

## 2019-05-09 ENCOUNTER — Other Ambulatory Visit: Payer: Self-pay

## 2019-05-09 ENCOUNTER — Ambulatory Visit (INDEPENDENT_AMBULATORY_CARE_PROVIDER_SITE_OTHER): Payer: Medicare Other | Admitting: Vascular Surgery

## 2019-05-09 VITALS — BP 157/99 | HR 63 | Resp 16 | Wt 168.8 lb

## 2019-05-09 DIAGNOSIS — K219 Gastro-esophageal reflux disease without esophagitis: Secondary | ICD-10-CM

## 2019-05-09 DIAGNOSIS — I10 Essential (primary) hypertension: Secondary | ICD-10-CM

## 2019-05-09 DIAGNOSIS — I70213 Atherosclerosis of native arteries of extremities with intermittent claudication, bilateral legs: Secondary | ICD-10-CM

## 2019-05-09 DIAGNOSIS — E785 Hyperlipidemia, unspecified: Secondary | ICD-10-CM | POA: Diagnosis not present

## 2019-05-09 NOTE — Progress Notes (Signed)
MRN : [id number]  Donna Welch is a 73 y.o. (June 05, 1946) female who presents with chief complaint of  Chief Complaint  Patient presents with   New Patient (Initial Visit)    ref Meeler for ble weakness  .  History of Present Illness:  The patient is seen for evaluation of painful lower extremities and diminished pulses. Patient notes the pain is always associated with activity and is very consistent day today. Typically, the pain occurs at less than one block, progress is as activity continues to the point that the patient must stop walking. Resting including standing still for several minutes allowed resumption of the activity and the ability to walk a similar distance before stopping again. Uneven terrain and inclined shorten the distance. The pain has been progressive over the past several years. The patient states the inability to walk is now having a profound negative impact on quality of life and daily activities.  The patient denies rest pain or dangling of an extremity off the side of the bed during the night for relief. No open wounds or sores at this time. No prior interventions or surgeries.  No history of back problems or DJD of the lumbar sacral spine.   The patient denies changes in claudication symptoms or new rest pain symptoms.  No new ulcers or wounds of the foot.  The patient's blood pressure has been stable and relatively well controlled. The patient denies amaurosis fugax or recent TIA symptoms. There are no recent neurological changes noted. The patient denies history of DVT, PE or superficial thrombophlebitis. The patient denies recent episodes of angina or shortness of breath.   Current Meds  Medication Sig   amLODipine (NORVASC) 5 MG tablet TAKE 1 TABLET BY MOUTH EVERY DAY   DOCOSAHEXAENOIC ACID-EPA PO Take by mouth.   fluticasone (FLONASE) 50 MCG/ACT nasal spray USE ONE SPRAY IN EACH NOSTRIL TWICE DAILY.   Multiple Vitamin (DAILY VITAMINS) tablet  Take by mouth.   oxybutynin (DITROPAN-XL) 10 MG 24 hr tablet Take 1 tablet (10 mg total) by mouth daily.   pantoprazole (PROTONIX) 40 MG tablet Take by mouth.   pravastatin (PRAVACHOL) 20 MG tablet Take by mouth.   telmisartan-hydrochlorothiazide (MICARDIS HCT) 80-25 MG tablet Take by mouth.   traZODone (DESYREL) 50 MG tablet TAKE 1 TABLET BY MOUTH NIGHTLY    Past Medical History:  Diagnosis Date   Allergy    GERD (gastroesophageal reflux disease)    Hyperlipidemia    Hypertension     Past Surgical History:  Procedure Laterality Date   ABDOMINAL HYSTERECTOMY      Social History Social History   Tobacco Use   Smoking status: Former Smoker    Packs/day: 0.50   Smokeless tobacco: Never Used   Tobacco comment: quit for a year then started back after mother died.  Substance Use Topics   Alcohol use: Yes    Alcohol/week: 14.0 standard drinks    Types: 14 Standard drinks or equivalent per week   Drug use: No    Family History Family History  Problem Relation Age of Onset   Diabetes Mother    Hypertension Mother    Breast cancer Neg Hx   No family history of bleeding/clotting disorders, porphyria or autoimmune disease   Allergies  Allergen Reactions   Hydrocodone-Acetaminophen Other (See Comments)    Other reaction(s): Other (See Comments) High blood pressure hyperactivity    Vicodin [Hydrocodone-Acetaminophen] Other (See Comments)    hyperactivity     REVIEW  OF SYSTEMS (Negative unless checked)  Constitutional: [] Weight loss  [] Fever  [] Chills Cardiac: [] Chest pain   [] Chest pressure   [] Palpitations   [] Shortness of breath when laying flat   [] Shortness of breath with exertion. Vascular:  [x] Pain in legs with walking   [] Pain in legs at rest  [] History of DVT   [] Phlebitis   [] Swelling in legs   [] Varicose veins   [] Non-healing ulcers Pulmonary:   [] Uses home oxygen   [] Productive cough   [] Hemoptysis   [] Wheeze  [] COPD    [] Asthma Neurologic:  [] Dizziness   [] Seizures   [] History of stroke   [] History of TIA  [] Aphasia   [] Vissual changes   [] Weakness or numbness in arm   [] Weakness or numbness in leg Musculoskeletal:   [] Joint swelling   [] Joint pain   [] Low back pain Hematologic:  [] Easy bruising  [] Easy bleeding   [] Hypercoagulable state   [] Anemic Gastrointestinal:  [] Diarrhea   [] Vomiting  [] Gastroesophageal reflux/heartburn   [] Difficulty swallowing. Genitourinary:  [] Chronic kidney disease   [] Difficult urination  [] Frequent urination   [] Blood in urine Skin:  [] Rashes   [] Ulcers  Psychological:  [] History of anxiety   []  History of major depression.  Physical Examination  Vitals:   05/09/19 1609  BP: (!) 157/99  Pulse: 63  Resp: 16  Weight: 168 lb 12.8 oz (76.6 kg)   Body mass index is 28.09 kg/m. Gen: WD/WN, NAD Head: [place]/AT, No temporalis wasting.  Ear/Nose/Throat: Hearing grossly intact, nares w/o erythema or drainage, poor dentition Eyes: PER, EOMI, sclera nonicteric.  Neck: Supple, no masses.  No bruit or JVD.  Pulmonary:  Good air movement, clear to auscultation bilaterally, no use of accessory muscles.  Cardiac: RRR, normal S1, S2, no Murmurs. Vascular:  Vessel Right Left  Radial Palpable Palpable  PT Not Palpable Not Palpable  DP Not Palpable Not Palpable  Gastrointestinal: soft, non-distended. No guarding/no peritoneal signs.  Musculoskeletal: M/S 5/5 throughout.  No deformity or atrophy.  Neurologic: CN 2-12 intact. Pain and light touch intact in extremities.  Symmetrical.  Speech is fluent. Motor exam as listed above. Psychiatric: Judgment intact, Mood & affect appropriate for pt's clinical situation. Dermatologic: No rashes or ulcers noted.  No changes consistent with cellulitis. Lymph : No Cervical lymphadenopathy, no lichenification or skin changes of chronic lymphedema.  CBC Lab Results  Component Value Date   WBC 10.0 03/05/2017   HGB 13.4 03/05/2017   HCT 38.6  03/05/2017   MCV 98.9 03/05/2017   PLT 213 03/05/2017    BMET    Component Value Date/Time   NA 140 03/05/2017 1644   NA 141 01/26/2015 1132   K 3.2 (L) 03/05/2017 1644   CL 105 03/05/2017 1644   CO2 28 03/05/2017 1644   GLUCOSE 126 (H) 03/05/2017 1644   BUN 13 03/05/2017 1644   BUN 13 01/26/2015 1132   CREATININE 0.82 03/05/2017 1644   CALCIUM 9.2 03/05/2017 1644   GFRNONAA >60 03/05/2017 1644   GFRAA >60 03/05/2017 1644   CrCl cannot be calculated (Patient's most recent lab result is older than the maximum 21 days allowed.).  COAG No results found for: INR, PROTIME  Radiology Mr Lumbar Spine Wo Contrast  Result Date: 04/13/2019 CLINICAL DATA:  Bilateral leg weakness and giving way for 3 months. No known injury. EXAM: MRI LUMBAR SPINE WITHOUT CONTRAST TECHNIQUE: Multiplanar, multisequence MR imaging of the lumbar spine was performed. No intravenous contrast was administered. COMPARISON:  None. FINDINGS: Segmentation:  Standard. Alignment: Trace  retrolisthesis L1 on L2 and L2 on L3. 0.5 cm anterolisthesis L4 on L5 and trace anterolisthesis L5 on S1 due to facet arthropathy also noted. Vertebrae:  No fracture, evidence of discitis, or bone lesion. Conus medullaris and cauda equina: Conus extends to the L1-2 level. Conus and cauda equina appear normal. Paraspinal and other soft tissues: Negative. Disc levels: T11-12 is imaged in the sagittal plane only and negative. T12-L1: Negative. L1-2: Shallow disc bulge is more prominent to the left. Mild facet degenerative disease is seen. No stenosis. L2-3: Shallow disc bulge and mild facet degenerative change. No stenosis. L3-4: Mild-to-moderate facet degenerative change is present. There is a shallow right paracentral protrusion but the central canal and foramina are open. L4-5: Advanced facet degenerative change. The disc is uncovered with a shallow bulge. There is mild narrowing in the subarticular recesses. Neural foramina are open. L5-S1:  Advanced facet degenerative change. There is slight disc uncovering without bulge or protrusion. No stenosis. 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 There is mild narrowing in the subarticular recesses at this level without nerve root compression. Electronically Signed   By: Drusilla Kanner M.D.   On: 04/13/2019 15:49     Assessment/Plan 1. Atherosclerosis of native artery of both lower extremities with intermittent claudication (HCC) Recommend:  The patient has experienced increased symptoms and is now describing lifestyle limiting claudication and perhaps mild rest pain.  Given the severity of the patient's lower extremity symptoms the patient should undergo CT angiography.    The patient should continue walking and begin a more formal exercise program.  The patient should continue antiplatelet therapy and aggressive treatment of the lipid abnormalities  The patient will follow up with me after the angiogram.   - CT ANGIO AO+BIFEM W & OR WO CONTRAST; Future  2. GERD without esophagitis Continue PPI as already ordered, this medication has been reviewed and there are no changes at this time.  Avoidence of caffeine and alcohol  Moderate elevation of the head of the bed   3. Hyperlipidemia, mild Continue statin as ordered and reviewed, no changes at this time   4. HTN, goal below 140/80 Continue antihypertensive medications as already ordered, these medications have been reviewed and there are no changes at this time.     Levora Dredge, MD  05/09/2019 4:53 PM

## 2019-05-19 ENCOUNTER — Other Ambulatory Visit: Payer: Self-pay

## 2019-05-19 ENCOUNTER — Ambulatory Visit
Admission: RE | Admit: 2019-05-19 | Discharge: 2019-05-19 | Disposition: A | Discharge status: Home / Self Care | Payer: Medicare Other | Source: Ambulatory Visit | Attending: Vascular Surgery | Admitting: Vascular Surgery

## 2019-05-19 DIAGNOSIS — I70213 Atherosclerosis of native arteries of extremities with intermittent claudication, bilateral legs: Secondary | ICD-10-CM | POA: Diagnosis not present

## 2019-05-19 LAB — POCT I-STAT CREATININE: Creatinine, Ser: 1.1 mg/dL — ABNORMAL HIGH (ref 0.44–1.00)

## 2019-05-19 MED ORDER — IOPAMIDOL (ISOVUE-370) INJECTION 76%
125.0000 mL | Freq: Once | INTRAVENOUS | Status: AC | PRN
  Administered 2019-05-19: 125 mL via INTRAVENOUS

## 2019-06-02 ENCOUNTER — Encounter (INDEPENDENT_AMBULATORY_CARE_PROVIDER_SITE_OTHER): Payer: Self-pay | Admitting: Vascular Surgery

## 2019-06-02 ENCOUNTER — Ambulatory Visit (INDEPENDENT_AMBULATORY_CARE_PROVIDER_SITE_OTHER): Payer: Medicare Other | Admitting: Vascular Surgery

## 2019-06-02 ENCOUNTER — Other Ambulatory Visit: Payer: Self-pay

## 2019-06-02 VITALS — BP 161/88 | HR 60 | Resp 20 | Ht 65.0 in | Wt 170.0 lb

## 2019-06-02 DIAGNOSIS — I70213 Atherosclerosis of native arteries of extremities with intermittent claudication, bilateral legs: Secondary | ICD-10-CM

## 2019-06-02 DIAGNOSIS — K219 Gastro-esophageal reflux disease without esophagitis: Secondary | ICD-10-CM | POA: Diagnosis not present

## 2019-06-02 DIAGNOSIS — E785 Hyperlipidemia, unspecified: Secondary | ICD-10-CM | POA: Diagnosis not present

## 2019-06-02 DIAGNOSIS — M4726 Other spondylosis with radiculopathy, lumbar region: Secondary | ICD-10-CM | POA: Diagnosis not present

## 2019-06-05 ENCOUNTER — Encounter (INDEPENDENT_AMBULATORY_CARE_PROVIDER_SITE_OTHER): Payer: Self-pay | Admitting: Vascular Surgery

## 2019-06-05 DIAGNOSIS — M47816 Spondylosis without myelopathy or radiculopathy, lumbar region: Secondary | ICD-10-CM | POA: Insufficient documentation

## 2019-06-05 NOTE — Progress Notes (Signed)
MRN : [id number]  Donna Welch is a 73 y.o. (07-Dec-1945) female who presents with chief complaint of  Chief Complaint  Patient presents with  . Follow-up    CT results  .  History of Present Illness:   The patient returns to the office for followup and review of the CT angiogram. There have been no interval changes in lower extremity symptoms. No interval shortening of the patient's claudication distance or development of rest pain symptoms. No new ulcers or wounds have occurred since the last visit.  There have been no significant changes to the patient's overall health care.  The patient denies amaurosis fugax or recent TIA symptoms. There are no recent neurological changes noted. The patient denies history of DVT, PE or superficial thrombophlebitis. The patient denies recent episodes of angina or shortness of breath.   I have personally reviewed the CT angiogram with the patient and there is nonhemodynamic plaque in the aorta and iliacs  She primarily has tibial disease.    Current Meds  Medication Sig  . amLODipine (NORVASC) 5 MG tablet TAKE 1 TABLET BY MOUTH EVERY DAY  . DOCOSAHEXAENOIC ACID-EPA PO Take by mouth.  . fexofenadine-pseudoephedrine (ALLEGRA-D) 60-120 MG per tablet Take 1 tablet by mouth 2 (two) times daily.  . fluticasone (FLONASE) 50 MCG/ACT nasal spray USE ONE SPRAY IN EACH NOSTRIL TWICE DAILY.  . Multiple Vitamin (DAILY VITAMINS) tablet Take by mouth.  . oxybutynin (DITROPAN-XL) 10 MG 24 hr tablet Take 1 tablet (10 mg total) by mouth daily.  Marland Kitchen oxybutynin (DITROPAN-XL) 10 MG 24 hr tablet Take 1 tablet (10 mg total) by mouth daily.  . pravastatin (PRAVACHOL) 20 MG tablet Take by mouth.  . telmisartan-hydrochlorothiazide (MICARDIS HCT) 80-25 MG tablet Take by mouth.  . traZODone (DESYREL) 50 MG tablet TAKE 1 TABLET BY MOUTH NIGHTLY    Past Medical History:  Diagnosis Date  . Allergy   . GERD (gastroesophageal reflux disease)   . Hyperlipidemia   .  Hypertension     Past Surgical History:  Procedure Laterality Date  . ABDOMINAL HYSTERECTOMY      Social History Social History   Tobacco Use  . Smoking status: Former Smoker    Packs/day: 0.50  . Smokeless tobacco: Never Used  . Tobacco comment: quit for a year then started back after mother died.  Substance Use Topics  . Alcohol use: Yes    Alcohol/week: 14.0 standard drinks    Types: 14 Standard drinks or equivalent per week  . Drug use: No    Family History Family History  Problem Relation Age of Onset  . Diabetes Mother   . Hypertension Mother   . Breast cancer Neg Hx     Allergies  Allergen Reactions  . Hydrocodone-Acetaminophen Other (See Comments)    Other reaction(s): Other (See Comments) High blood pressure hyperactivity   . Vicodin [Hydrocodone-Acetaminophen] Other (See Comments)    hyperactivity     REVIEW OF SYSTEMS (Negative unless checked)  Constitutional: [] Weight loss  [] Fever  [] Chills Cardiac: [] Chest pain   [] Chest pressure   [] Palpitations   [] Shortness of breath when laying flat   [] Shortness of breath with exertion. Vascular:  [x] Pain in legs with walking   [] Pain in legs at rest  [] History of DVT   [] Phlebitis   [] Swelling in legs   [] Varicose veins   [] Non-healing ulcers Pulmonary:   [] Uses home oxygen   [] Productive cough   [] Hemoptysis   [] Wheeze  [] COPD   [] Asthma  Neurologic:  [] Dizziness   [] Seizures   [] History of stroke   [] History of TIA  [] Aphasia   [] Vissual changes   [] Weakness or numbness in arm   [] Weakness or numbness in leg Musculoskeletal:   [] Joint swelling   [x] Joint pain   [x] Low back pain Hematologic:  [] Easy bruising  [] Easy bleeding   [] Hypercoagulable state   [] Anemic Gastrointestinal:  [] Diarrhea   [] Vomiting  [] Gastroesophageal reflux/heartburn   [] Difficulty swallowing. Genitourinary:  [] Chronic kidney disease   [] Difficult urination  [] Frequent urination   [] Blood in urine Skin:  [] Rashes   [] Ulcers   Psychological:  [] History of anxiety   []  History of major depression.  Physical Examination  Vitals:   06/02/19 1106  BP: (!) 161/88  Pulse: 60  Resp: 20  Weight: 170 lb (77.1 kg)  Height: 5\' 5"  (1.651 m)   Body mass index is 28.29 kg/m. Gen: WD/WN, NAD Head: [place]/AT, No temporalis wasting.  Ear/Nose/Throat: Hearing grossly intact, nares w/o erythema or drainage Eyes: PER, EOMI, sclera nonicteric.  Neck: Supple, no large masses.   Pulmonary:  Good air movement, no audible wheezing bilaterally, no use of accessory muscles.  Cardiac: RRR, no JVD Vascular:  Vessel Right Left  Radial Palpable Palpable  PT Not Palpable Not Palpable  DP Not Palpable Not Palpable  Gastrointestinal: Non-distended. No guarding/no peritoneal signs.  Musculoskeletal: M/S 5/5 throughout.  No deformity or atrophy.  Neurologic: CN 2-12 intact. Symmetrical.  Speech is fluent. Motor exam as listed above. Psychiatric: Judgment intact, Mood & affect appropriate for pt's clinical situation. Dermatologic: No rashes or ulcers noted.  No changes consistent with cellulitis. Lymph : No lichenification or skin changes of chronic lymphedema.  CBC Lab Results  Component Value Date   WBC 10.0 03/05/2017   HGB 13.4 03/05/2017   HCT 38.6 03/05/2017   MCV 98.9 03/05/2017   PLT 213 03/05/2017    BMET    Component Value Date/Time   NA 140 03/05/2017 1644   NA 141 01/26/2015 1132   K 3.2 (L) 03/05/2017 1644   CL 105 03/05/2017 1644   CO2 28 03/05/2017 1644   GLUCOSE 126 (H) 03/05/2017 1644   BUN 13 03/05/2017 1644   BUN 13 01/26/2015 1132   CREATININE 1.10 (H) 05/19/2019 1545   CALCIUM 9.2 03/05/2017 1644   GFRNONAA >60 03/05/2017 1644   GFRAA >60 03/05/2017 1644   Estimated Creatinine Clearance: 48.1 mL/min (A) (by C-G formula based on SCr of 1.1 mg/dL (H)).  COAG No results found for: INR, PROTIME  Radiology Ct Angio Ao+bifem W & Or Wo Contrast  Result Date: 05/20/2019 CLINICAL DATA:  painful lower  extremities and diminished pulses. 1 block claudication, progressive over the past several years. No rest pain, open wounds or sores, prior interventions or surgeries, history of back problems or DJD of the lumbar sacral spine. EXAM: CT ANGIOGRAPHY OF ABDOMINAL AORTA WITH ILIOFEMORAL RUNOFF TECHNIQUE: Multidetector CT imaging of the abdomen, pelvis and lower extremities was performed using the standard protocol during bolus administration of intravenous contrast. Multiplanar CT image reconstructions and MIPs were obtained to evaluate the vascular anatomy. CONTRAST:  126mL ISOVUE-370 IOPAMIDOL (ISOVUE-370) INJECTION 76% COMPARISON:  01/28/2017 and previous FINDINGS: VASCULAR Aorta: Moderate scattered calcified atheromatous plaque particularly in the infrarenal segment. No aneurysm, dissection, or stenosis. Celiac: Calcified ostial plaque resulting in short segment stenosis of at least moderate severity, patent distally. SMA: Mild ostial plaque without significant stenosis, patent distally with classic distal branch anatomy. Renals: Single left, with calcified ostial  plaque resulting in short segment stenosis of at least moderate severity, with some poststenotic dilatation, patent distally. Single right, with mild ostial plaque, no high-grade stenosis. IMA: Patent without evidence of aneurysm, dissection, vasculitis or significant stenosis. RIGHT Lower Extremity Inflow: Calcified plaque through the common iliac without stenosis. Origin stenosis of the internal iliac, patent distally. External iliac widely patent. Outflow: Common femoral widely patent. Tandem areas of high-grade stenosis in the distal SFA and proximal popliteal artery, of probable cumulative hemodynamic significance. Popliteal is patent distally across the knee. Runoff: Contiguous anterior tibial runoff. Diffusely diseased peroneal and posterior tibial arteries which occlude above the ankle. LEFT Lower Extremity Inflow: Moderate plaque through the  common iliac with short-segment dissection or penetrating atheromatous ulcer, no high-grade stenosis. Mild origin stenosis of the internal iliac artery. External iliac widely patent. Outflow: Common femoral patent. Deep femoral branches patent. Mild short-segment origin stenosis of the SFA, patent distally. Mild atheromatous narrowing in the popliteal artery above the knee, patent distally. Runoff: High origin of the anterior tibial artery which is mildly diseased but patent across the ankle as dorsalis pedis. Posterior tibial artery occludes mid calf. Peroneal artery is patent with posterior communicating branch crossing the ankle. Veins: No obvious venous abnormality within the limitations of this arterial phase study. Review of the MIP images confirms the above findings. NON-VASCULAR Lower chest: 6 cm subpleural nodule laterally in the right lower lobe image 6/5, an area not previously imaged . Stable 6 mm subpleural nodule in the lateral basal segment left lower lobe. No pleural or pericardial effusion. Hepatobiliary: Gallbladder decompressed. Liver unremarkable. No biliary ductal dilatation. Pancreas: Unremarkable. No pancreatic ductal dilatation or surrounding inflammatory changes. Spleen: Normal in size without focal abnormality. Adrenals/Urinary Tract: Adrenal glands are unremarkable. Kidneys are normal, without renal calculi, focal lesion, or hydronephrosis. Bladder is unremarkable. Stomach/Bowel: Stomach and small bowel decompressed. Appendix not discretely identified. The colon is nondilated, unremarkable. Lymphatic: No adenopathy identified Reproductive: Status post hysterectomy. No adnexal masses. Other: No ascites. No free air. Musculoskeletal: Multilevel spondylitic changes in the lumbar spine with grade 1 anterolisthesis L4-5 probably related to advanced facet DJD. Bilateral knee DJD left greater than right. No fracture or worrisome bone lesion. IMPRESSION: 1. Aortoiliac atherosclerosis without  significant occlusive disease. 2. Short-segment dissection or penetrating atheromatous ulcer in the left common iliac artery, without stenosis. 3. Tandem RIGHT distal SFA and proximal popliteal stenoses of probable hemodynamic significance, with contiguous anterior tibial runoff. 4. Origin stenosis of the LEFT SFA of indeterminate hemodynamic significance, correlate with segmental pressures. 5. 2 vessel LEFT tibial runoff. 6. 6 mm right lower lobe nodule. Non-contrast chest CT at 6-12 months is recommended. If the nodule is stable at time of repeat CT, then future CT at 18-24 months (from today's scan) is considered optional for low-risk patients, but is recommended for high-risk patients. This recommendation follows the consensus statement: Guidelines for Management of Incidental Pulmonary Nodules Detected on CT Images: From the Fleischner Society 2017; Radiology 2017; 284:228-243. Electronically Signed   By: Corlis Leak  Hassell M.D.   On: 05/20/2019 08:38     Assessment/Plan 1. Atherosclerosis of native artery of both lower extremities with intermittent claudication (HCC)  Recommend:  The patient has evidence of atherosclerosis of the lower extremities with claudication.  The patient does not voice lifestyle limiting changes at this point in time.  Noninvasive studies do not suggest clinically significant change.  No invasive studies, angiography or surgery at this time The patient should continue walking and begin a more  formal exercise program.  The patient should continue antiplatelet therapy and aggressive treatment of the lipid abnormalities  No changes in the patient's medications at this time  The patient should continue wearing graduated compression socks 10-15 mmHg strength to control the mild edema.   - ABI; Future  2. Osteoarthritis of spine with radiculopathy, lumbar region Recommend:  I do not find evidence of Vascular pathology that would explain the patient's symptoms.  Her inflow  stenosis is minimal yet her leg pain begins at the hip.  This does not correlate.   The patient has atypical pain symptoms for vascular disease  I do not find evidence of Vascular pathology that would explain the patient's symptoms and I suspect the patient is c/o pseudoclaudication.  Patient should have an evaluation of his LS spine which I defer to the primary service.  Noninvasive studies including venous ultrasound of the legs do not identify vascular problems  The patient should continue walking and begin a more formal exercise program. The patient should continue his antiplatelet therapy and aggressive treatment of the lipid abnormalities. The patient should begin wearing graduated compression socks 15-20 mmHg strength to control her mild edema.  Patient will follow-up with me on a PRN basis  Further work-up of her lower extremity pain is deferred to the primary service     3. GERD without esophagitis Continue PPI as already ordered, this medication has been reviewed and there are no changes at this time.  Avoidence of caffeine and alcohol  Moderate elevation of the head of the bed   4. Hyperlipidemia, mild Continue statin as ordered and reviewed, no changes at this time    Levora Dredge, MD  06/05/2019 11:35 AM

## 2019-07-06 DIAGNOSIS — J1282 Pneumonia due to coronavirus disease 2019: Secondary | ICD-10-CM | POA: Insufficient documentation

## 2019-07-06 DIAGNOSIS — R0902 Hypoxemia: Secondary | ICD-10-CM | POA: Insufficient documentation

## 2019-07-13 DIAGNOSIS — N179 Acute kidney failure, unspecified: Secondary | ICD-10-CM | POA: Insufficient documentation

## 2020-03-20 ENCOUNTER — Other Ambulatory Visit: Payer: Self-pay | Admitting: Internal Medicine

## 2020-03-20 DIAGNOSIS — Z1231 Encounter for screening mammogram for malignant neoplasm of breast: Secondary | ICD-10-CM

## 2020-05-25 ENCOUNTER — Other Ambulatory Visit: Payer: Self-pay | Admitting: Orthopedic Surgery

## 2020-05-25 DIAGNOSIS — G8929 Other chronic pain: Secondary | ICD-10-CM

## 2020-05-25 DIAGNOSIS — M2392 Unspecified internal derangement of left knee: Secondary | ICD-10-CM

## 2020-05-25 DIAGNOSIS — M1712 Unilateral primary osteoarthritis, left knee: Secondary | ICD-10-CM

## 2020-05-31 ENCOUNTER — Ambulatory Visit
Admission: RE | Admit: 2020-05-31 | Discharge: 2020-05-31 | Disposition: A | Discharge status: Home / Self Care | Payer: Medicare Other | Source: Ambulatory Visit | Attending: Orthopedic Surgery | Admitting: Orthopedic Surgery

## 2020-05-31 ENCOUNTER — Other Ambulatory Visit: Payer: Self-pay

## 2020-05-31 DIAGNOSIS — G8929 Other chronic pain: Secondary | ICD-10-CM

## 2020-05-31 DIAGNOSIS — M25562 Pain in left knee: Secondary | ICD-10-CM | POA: Insufficient documentation

## 2020-05-31 DIAGNOSIS — M1712 Unilateral primary osteoarthritis, left knee: Secondary | ICD-10-CM | POA: Diagnosis present

## 2020-05-31 DIAGNOSIS — M2392 Unspecified internal derangement of left knee: Secondary | ICD-10-CM | POA: Insufficient documentation

## 2020-06-04 ENCOUNTER — Encounter (INDEPENDENT_AMBULATORY_CARE_PROVIDER_SITE_OTHER): Payer: Medicare Other

## 2020-06-04 ENCOUNTER — Ambulatory Visit (INDEPENDENT_AMBULATORY_CARE_PROVIDER_SITE_OTHER): Payer: Medicare Other | Admitting: Vascular Surgery

## 2020-06-22 DIAGNOSIS — S83231A Complex tear of medial meniscus, current injury, right knee, initial encounter: Secondary | ICD-10-CM | POA: Insufficient documentation

## 2020-06-22 DIAGNOSIS — M1711 Unilateral primary osteoarthritis, right knee: Secondary | ICD-10-CM | POA: Insufficient documentation

## 2020-12-04 ENCOUNTER — Other Ambulatory Visit: Payer: Self-pay

## 2020-12-04 ENCOUNTER — Emergency Department: Payer: Medicare Other

## 2020-12-04 ENCOUNTER — Emergency Department
Admission: EM | Admit: 2020-12-04 | Discharge: 2020-12-04 | Disposition: A | Discharge status: Home / Self Care | Payer: Medicare Other | Attending: Emergency Medicine | Admitting: Emergency Medicine

## 2020-12-04 DIAGNOSIS — R072 Precordial pain: Secondary | ICD-10-CM | POA: Diagnosis present

## 2020-12-04 DIAGNOSIS — Z79899 Other long term (current) drug therapy: Secondary | ICD-10-CM | POA: Insufficient documentation

## 2020-12-04 DIAGNOSIS — R0789 Other chest pain: Secondary | ICD-10-CM

## 2020-12-04 DIAGNOSIS — Z87891 Personal history of nicotine dependence: Secondary | ICD-10-CM | POA: Insufficient documentation

## 2020-12-04 DIAGNOSIS — I10 Essential (primary) hypertension: Secondary | ICD-10-CM | POA: Insufficient documentation

## 2020-12-04 LAB — BASIC METABOLIC PANEL
Anion gap: 11 (ref 5–15)
BUN: 16 mg/dL (ref 8–23)
CO2: 25 mmol/L (ref 22–32)
Calcium: 9.4 mg/dL (ref 8.9–10.3)
Chloride: 102 mmol/L (ref 98–111)
Creatinine, Ser: 0.72 mg/dL (ref 0.44–1.00)
GFR, Estimated: >60 mL/min (ref >60)
Glucose, Bld: 127 mg/dL — ABNORMAL HIGH (ref 70–99)
Potassium: 3.8 mmol/L (ref 3.5–5.1)
Sodium: 138 mmol/L (ref 135–145)

## 2020-12-04 LAB — CBC
HCT: 40.5 % (ref 36.0–46.0)
Hemoglobin: 13.5 g/dL (ref 12.0–15.0)
MCH: 32.5 pg (ref 26.0–34.0)
MCHC: 33.3 g/dL (ref 30.0–36.0)
MCV: 97.4 fL (ref 80.0–100.0)
Platelets: 259 10*3/uL (ref 150–400)
RBC: 4.16 MIL/uL (ref 3.87–5.11)
RDW: 11.6 % (ref 11.5–15.5)
WBC: 9.9 10*3/uL (ref 4.0–10.5)
nRBC: 0 % (ref 0.0–0.2)

## 2020-12-04 LAB — TROPONIN I (HIGH SENSITIVITY)
Troponin I (High Sensitivity): 6 ng/L (ref <18)
Troponin I (High Sensitivity): 6 ng/L (ref <18)

## 2020-12-04 NOTE — Discharge Instructions (Addendum)
Return to the ER for new, worsening, or persistent severe chest pain, difficulty breathing, weakness or lightheadedness, or any other new or worsening symptoms that concern you. 

## 2020-12-04 NOTE — ED Provider Notes (Signed)
[facility] Emergency Department Provider Note ____________________________________________   Event Date/Time   First MD Initiated Contact with Patient 12/04/20 1931     (approximate)  I have reviewed the triage vital signs and the nursing notes.   HISTORY  Chief Complaint Chest Pain    HPI Donna Welch is a 75 y.o. female with PMH as noted below including hypertension, hyperlipidemia peripheral vascular disease in her lower extremities, and GERD (but no prior cardiac history) presents with chest pain, acute onset a few hours ago, substernal, described as sharp and squeezing.  It occurred while she was in a store.  It then improved but again came back when she was driving.  Has now resolved.  The patient states that she has had similar pains in the past, but this is the first time that it lasted for more than a few minutes.  She denies any associated shortness of breath, dizziness or lightheadedness, nausea or vomiting, or any other acute symptoms.  She denies any leg pain or swelling.She states that it occurred not long after she had eaten lunch.    Past Medical History:  Diagnosis Date  . Allergy   . GERD (gastroesophageal reflux disease)   . Hyperlipidemia   . Hypertension     Patient Active Problem List   Diagnosis Date Noted  . DJD (degenerative joint disease), lumbar 06/05/2019  . Atherosclerosis of native arteries of extremity with intermittent claudication (HCC) 02/09/2019  . Bradycardia 02/09/2019  . SOBOE (shortness of breath on exertion) 01/18/2019  . Left shoulder pain 03/10/2017  . 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 Positive ANA (antinuclear antibody) 02/23/2017  . Unintentional weight loss 02/23/2017  . Hyperlipidemia, mild 01/30/2016  . HTN, goal below 140/80 08/02/2015  . Current tobacco use 08/02/2015  . GERD without esophagitis 08/02/2015  . SUI (stress urinary incontinence, female) 08/02/2015    Past Surgical  History:  Procedure Laterality Date  . ABDOMINAL HYSTERECTOMY      Prior to Admission medications   Medication Sig Start Date End Date Taking? Authorizing Provider  amLODipine (NORVASC) 5 MG tablet TAKE 1 TABLET BY MOUTH EVERY DAY 07/12/15   Dennison Mascot, MD  amoxicillin (AMOXIL) 875 MG tablet Take 1 tablet (875 mg total) by mouth 2 (two) times daily. Patient not taking: Reported on 06/02/2019 06/19/15   Payton Mccallum, MD  DOCOSAHEXAENOIC ACID-EPA PO Take by mouth.    [provider]  fexofenadine-pseudoephedrine (ALLEGRA-D) 60-120 MG per tablet Take 1 tablet by mouth 2 (two) times daily. 01/08/15   Dennison Mascot, MD  fluconazole (DIFLUCAN) 150 MG tablet Take 1 tablet (150 mg total) by mouth daily. Patient not taking: Reported on 06/02/2019 06/19/15   Payton Mccallum, MD  fluticasone (FLONASE) 50 MCG/ACT nasal spray USE ONE SPRAY IN EACH NOSTRIL TWICE DAILY. 03/14/15   Dennison Mascot, MD  losartan-hydrochlorothiazide (HYZAAR) 100-25 MG per tablet Take 1 tablet by mouth daily. Patient not taking: Reported on 06/02/2019 01/08/15   Dennison Mascot, MD  Multiple Vitamin (DAILY VITAMINS) tablet Take by mouth. 12/06/02   [provider]  omeprazole (PRILOSEC) 40 MG capsule TAKE 1 CAPSULE BY MOUTH EVERY DAY Patient not taking: Reported on 06/02/2019 07/31/15   Dennison Mascot, MD  oxybutynin (DITROPAN-XL) 10 MG 24 hr tablet Take 1 tablet (10 mg total) by mouth daily. 01/08/15   Dennison Mascot, MD  oxybutynin (DITROPAN-XL) 10 MG 24 hr tablet Take 1 tablet (10 mg total) by mouth daily. 01/08/15   Dennison Mascot, MD  pantoprazole (  PROTONIX) 40 MG tablet Take by mouth. 03/30/19 03/29/20  [provider]  pravastatin (PRAVACHOL) 20 MG tablet Take by mouth. 03/07/19 03/06/20  [provider]  telmisartan-hydrochlorothiazide (MICARDIS HCT) 80-25 MG tablet Take by mouth. 11/15/18 11/15/19  [provider]  traZODone (DESYREL) 50 MG tablet TAKE 1 TABLET BY MOUTH  NIGHTLY 05/01/19   [provider]    Allergies Hydrocodone-acetaminophen and Vicodin [hydrocodone-acetaminophen]  Family History  Problem Relation Age of Onset  . Diabetes Mother   . Hypertension Mother   . Breast cancer Neg Hx     Social History Social History   Tobacco Use  . Smoking status: Former Smoker    Packs/day: 0.50  . Smokeless tobacco: Never Used  . Tobacco comment: quit for a year then started back after mother died.  Substance Use Topics  . Alcohol use: Yes    Alcohol/week: 14.0 standard drinks    Types: 14 Standard drinks or equivalent per week  . Drug use: No    Review of Systems  Constitutional: No fever/chills Eyes: No visual changes. ENT: No sore throat. Cardiovascular: Positive for resolved chest pain. Respiratory: Denies shortness of breath. Gastrointestinal: No nausea, no vomiting.  No diarrhea.  Genitourinary: Negative for dysuria.  Musculoskeletal: Negative for back pain. Skin: Negative for rash. Neurological: Negative for headaches, focal weakness or numbness.   ____________________________________________   PHYSICAL EXAM:  VITAL SIGNS: ED Triage Vitals [12/04/20 1739]  Enc Vitals Group     BP      Pulse      Resp      Temp      Temp src      SpO2      Weight      Height      Head Circumference      Peak Flow      Pain Score 0     Pain Loc      Pain Edu?      Excl. in GC?     Constitutional: Alert and oriented. Well appearing and in no acute distress. Eyes: Conjunctivae are normal.  Head: Atraumatic. Nose: No congestion/rhinnorhea. Mouth/Throat: Mucous membranes are moist.   Neck: Normal range of motion.  Cardiovascular: Normal rate, regular rhythm. Grossly normal heart sounds.  Good peripheral circulation. Respiratory: Normal respiratory effort.  No retractions. Lungs CTAB. Gastrointestinal: No distention.  Musculoskeletal: No lower extremity edema.  No calf or popliteal swelling or tenderness.  Extremities  warm and well perfused.  Neurologic:  Normal speech and language. No gross focal neurologic deficits are appreciated.  Skin:  Skin is warm and dry. No rash noted. Psychiatric: Mood and affect are normal. Speech and behavior are normal.  ____________________________________________   LABS (all labs ordered are listed, but only abnormal results are displayed)  Labs Reviewed  BASIC METABOLIC PANEL - Abnormal; Notable for the following components:      Result Value   Glucose, Bld 127 (*)    All other components within normal limits  CBC  TROPONIN I (HIGH SENSITIVITY)  TROPONIN I (HIGH SENSITIVITY)   ____________________________________________  EKG  ED ECG REPORT I, Dionne Bucy, the attending physician, personally viewed and interpreted this ECG.  Date: 12/04/2020 EKG Time: 1742  Rate: 60 Rhythm: normal sinus rhythm QRS Axis: Right axis Intervals: normal ST/T Wave abnormalities: normal Narrative Interpretation: no evidence of acute ischemia; no significant change when compared to EKG of 03/05/2017  ____________________________________________  RADIOLOGY  Chest x-ray interpreted by me shows no focal consolidation or  edema  ____________________________________________   PROCEDURES  Procedure(s) performed: No  Procedures  Critical Care performed: No ____________________________________________   INITIAL IMPRESSION / ASSESSMENT AND PLAN / ED COURSE  Pertinent labs & imaging results that were available during my care of the patient were reviewed by me and considered in my medical decision making (see chart for details).  75 year old female with PMH as noted above including hypertension, hyperlipidemia peripheral vascular disease in her lower extremities, and GERD resents with atypical nonexertional chest pain that was waxing and waning this afternoon and is now resolved.  She denies any significant associated symptoms and states that she has had similar pain in  the past, but it did not last as long.    I reviewed the past medical records in Epic.  The patient has peripheral vascular disease in her lower extremities but has no cardiac history.  She was last seen in the ED in 2018 with shoulder pain.  On exam, the patient is overall well-appearing.  Her vital signs are normal.  The physical exam is unremarkable.  EKG is nonischemic.  Overall presentation is consistent with GERD, musculoskeletal pain, radiculopathy, or other benign etiology given the atypical and nonexertional nature.  The patient does have some cardiac risk factors although no personal history of CAD.  We will obtain troponins x2 and reassess.  Anticipate discharge home if the enzymes are negative and the patient has no recurrence of her symptoms.  ----------------------------------------- 8:41 PM on 12/04/2020 -----------------------------------------  Repeat troponin is negative.  The patient continues to be asymptomatic.  She is stable for discharge at this time.  I counseled her on the results of the work-up.  Return precautions given, and she expresses understanding.  ____________________________________________   FINAL CLINICAL IMPRESSION(S) / ED DIAGNOSES  Final diagnoses:  Atypical chest pain      NEW MEDICATIONS STARTED DURING THIS VISIT:  New Prescriptions   No medications on file     Note:  This document was prepared using Dragon voice recognition software and may include unintentional dictation errors.   Dionne Bucy, MD 12/04/20 2041

## 2020-12-04 NOTE — ED Triage Notes (Signed)
Pt comes with c/o CP that started earlier  Today. Pt states pain to left sided of chest that radiates around. Pt states lower back pain earlier this morning.  Pt denies any SOB

## 2020-12-26 DIAGNOSIS — Z01810 Encounter for preprocedural cardiovascular examination: Secondary | ICD-10-CM | POA: Insufficient documentation

## 2021-01-16 ENCOUNTER — Other Ambulatory Visit: Payer: Self-pay | Admitting: Internal Medicine

## 2021-01-16 DIAGNOSIS — Z1231 Encounter for screening mammogram for malignant neoplasm of breast: Secondary | ICD-10-CM

## 2021-03-12 DIAGNOSIS — Z96652 Presence of left artificial knee joint: Secondary | ICD-10-CM | POA: Insufficient documentation

## 2021-08-11 IMAGING — MR MR LUMBAR SPINE W/O CM
5 series · 31 of 48 positions shown · non-contrast
Comparison: None.

CLINICAL DATA: Bilateral leg weakness and giving way for 3 months.
No known injury.

EXAM:
MRI LUMBAR SPINE WITHOUT CONTRAST
TECHNIQUE: Multiplanar, multisequence MR imaging of the lumbar spine was
performed. No intravenous contrast was administered.

[Series 5: T2 · sagittal · 4.0mm · 0.81mm/px · 6 of 17 slices shown (1 of 2)]
[im 1/17]
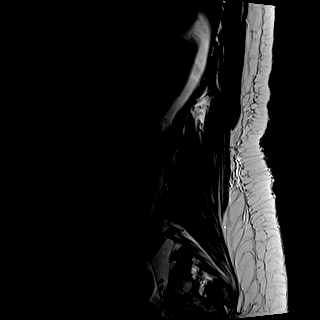
[im 4/17]
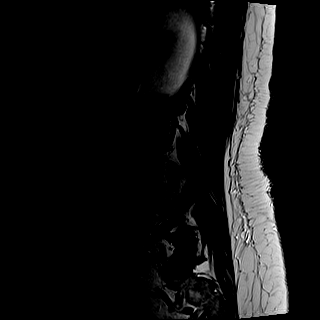
[im 7/17]
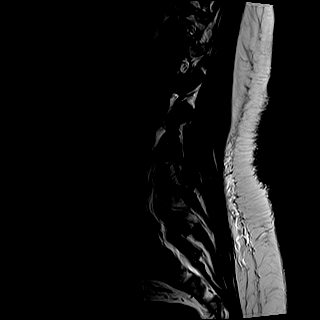
[im 10/17]
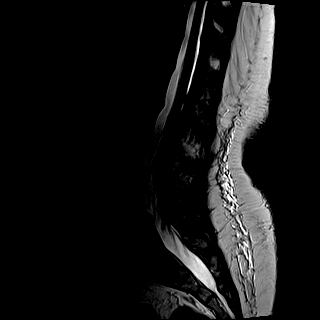
[im 13/17]
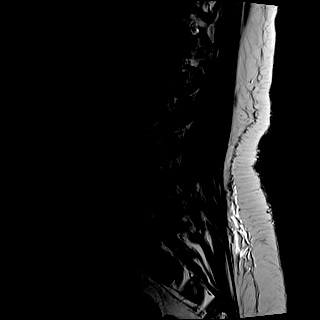
[im 17/17]
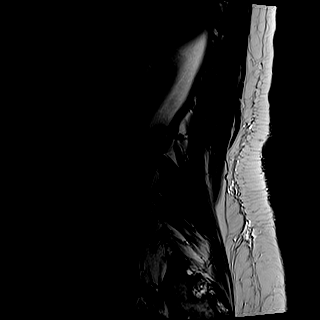

[Series 6: T1 · sagittal · 4.0mm · 0.81mm/px · 7 of 17 slices shown (1 of 2)]
[im 1/17]
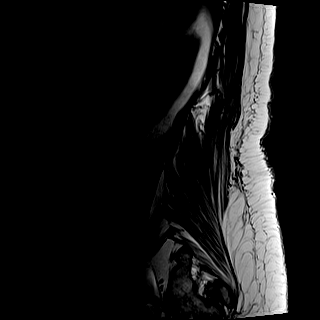
[im 3/17]
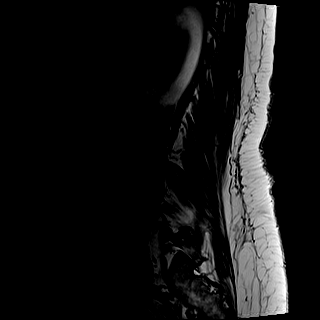
[im 6/17]
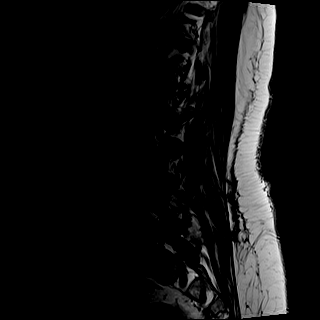
[im 9/17]
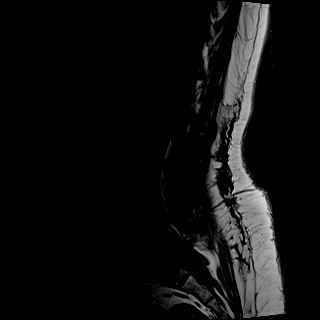
[im 11/17]
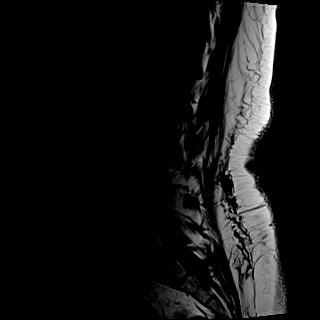
[im 14/17]
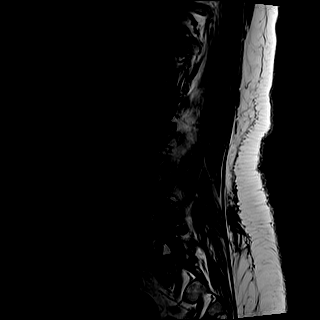
[im 17/17]
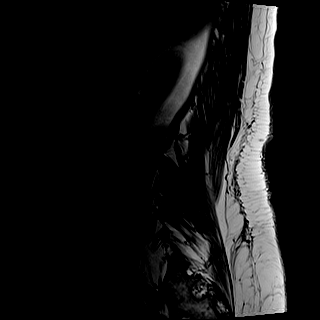

[Series 7: STIR · sagittal · 4.0mm · 0.41mm/px · 2 of 17 slices shown]
[im 1/17]
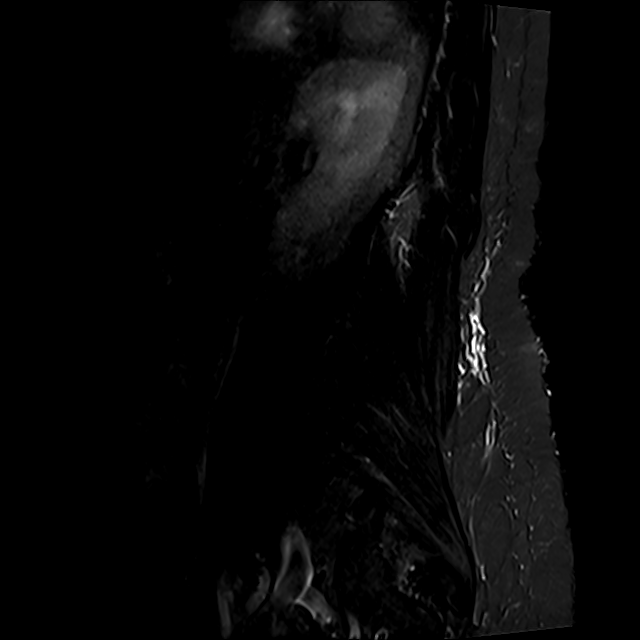
[im 3/17]
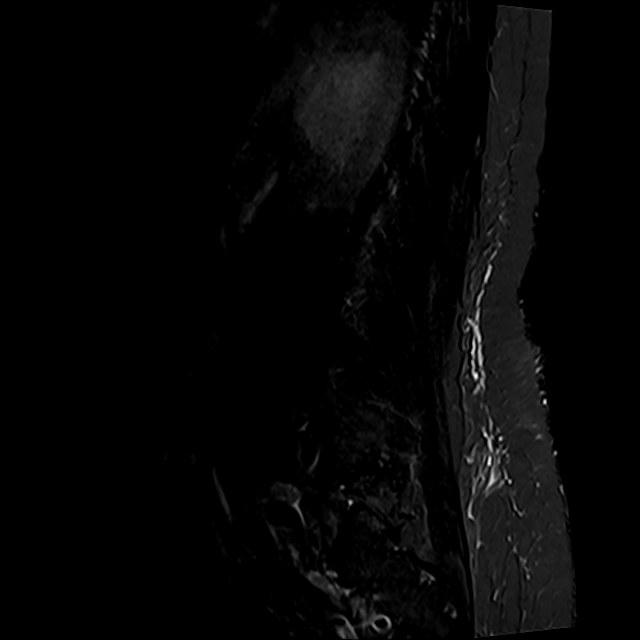

[Series 8: T2 · axial · 4.0mm · 0.78mm/px · z∈[-124,+79]mm · 8 of 36 slices shown (2 of 2)]
[im 1/36]
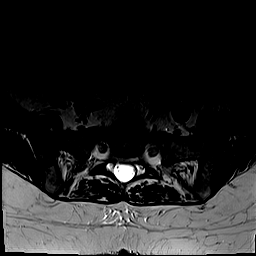
[im 6/36]
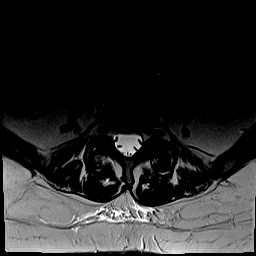
[im 11/36]
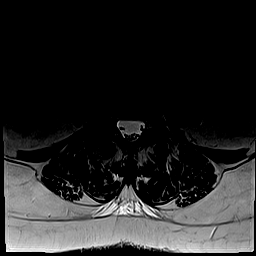
[im 17/36]
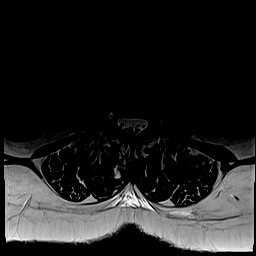
[im 19/36]
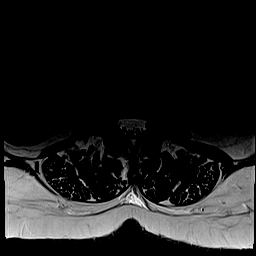
[im 25/36]
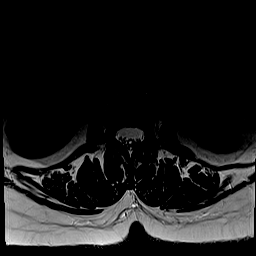
[im 30/36]
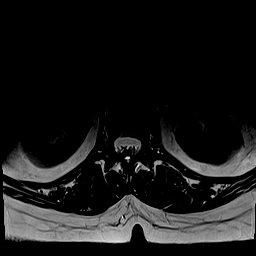
[im 36/36]
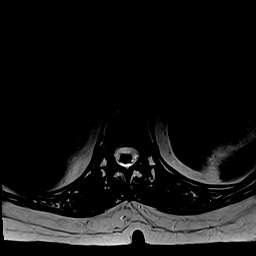

[Series 9: T1 · axial · 4.0mm · 0.39mm/px · z∈[-124,+79]mm · 8 of 36 slices shown (2 of 2)]
[im 1/36]
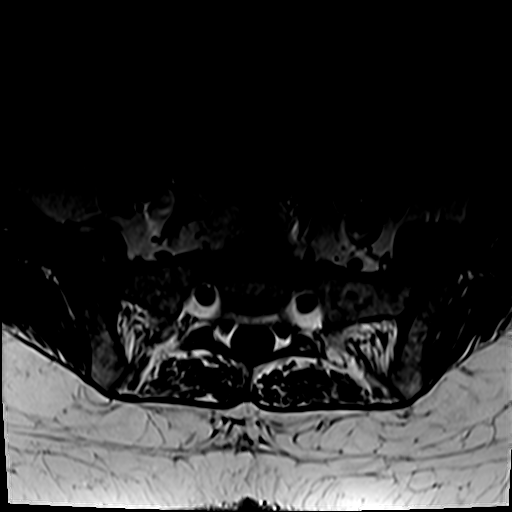
[im 6/36]
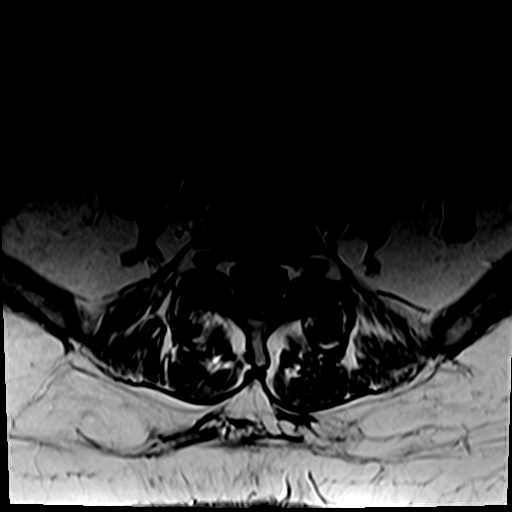
[im 11/36]
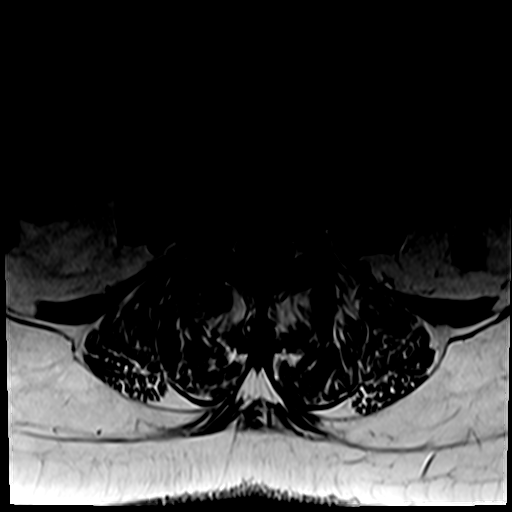
[im 17/36]
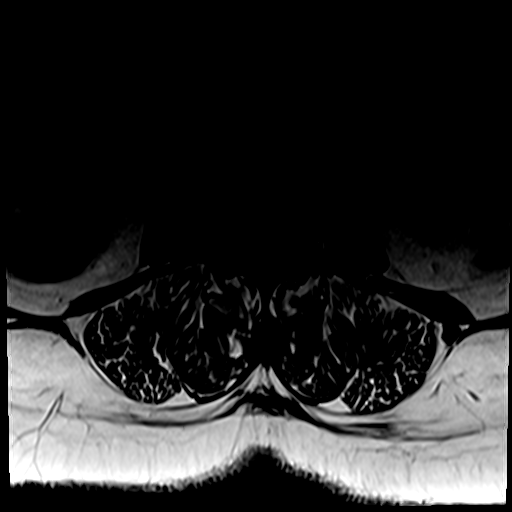
[im 19/36]
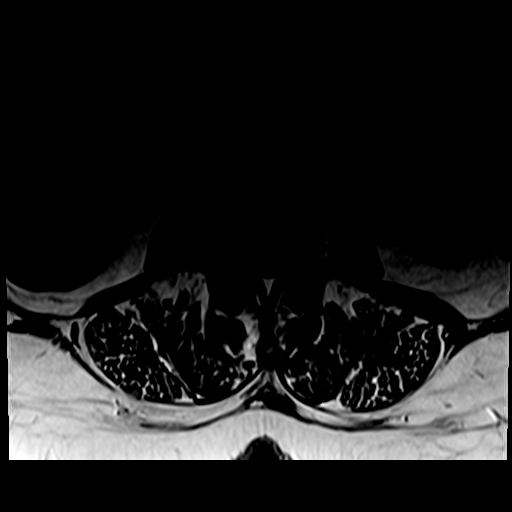
[im 25/36]
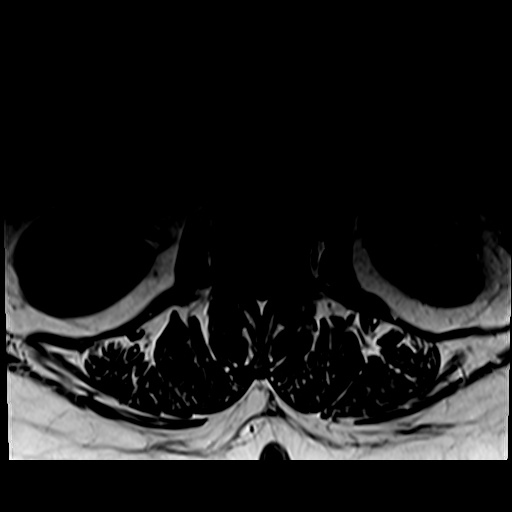
[im 30/36]
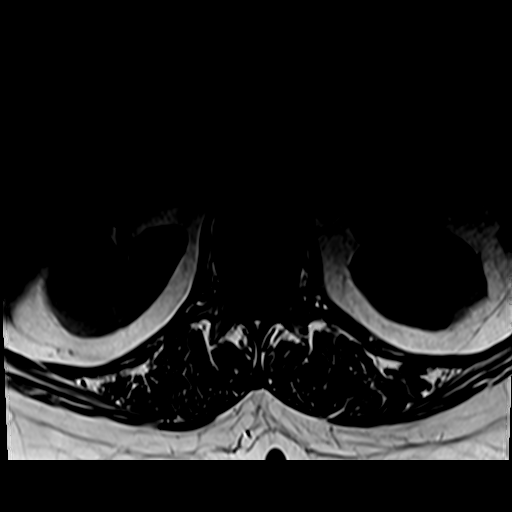
[im 36/36]
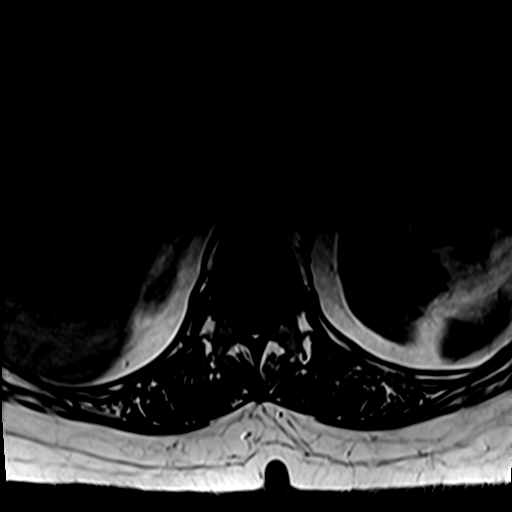

[31 of 48 positions shown; findings below may reference images not displayed]

FINDINGS: Segmentation:  Standard.

Alignment: Trace retrolisthesis L1 on L2 and L2 on L3. 0.5 cm
anterolisthesis L4 on L5 and trace anterolisthesis L5 on S1 due to
facet arthropathy also noted.

Vertebrae:  No fracture, evidence of discitis, or bone lesion.

Conus medullaris and cauda equina: Conus extends to the L1-2 level.
Conus and cauda equina appear normal.

Paraspinal and other soft tissues: Negative.

Disc levels:

T11-12 is imaged in the sagittal plane only and negative.

T12-L1: Negative.

L1-2: Shallow disc bulge is more prominent to the left. Mild facet
degenerative disease is seen. No stenosis.

L2-3: Shallow disc bulge and mild facet degenerative change. No
stenosis.

L3-4: Mild-to-moderate facet degenerative change is present. There
is a shallow right paracentral protrusion but the central canal and
foramina are open.

L4-5: Advanced facet degenerative change. The disc is uncovered with
a shallow bulge. There is mild narrowing in the subarticular
recesses. Neural foramina are open.

L5-S1: Advanced facet degenerative change. There is slight disc
uncovering without bulge or protrusion. No stenosis.
IMPRESSION: Spondylosis most notable at L4-5 where advanced facet arthropathy
results in 0.5 cm anterolisthesis. There is mild narrowing in the
subarticular recesses at this level without nerve root compression.

## 2021-08-25 ENCOUNTER — Other Ambulatory Visit: Payer: Self-pay

## 2021-08-25 ENCOUNTER — Ambulatory Visit
Admission: EM | Admit: 2021-08-25 | Discharge: 2021-08-25 | Disposition: A | Discharge status: Home / Self Care | Payer: Medicare Other | Attending: Emergency Medicine | Admitting: Emergency Medicine

## 2021-08-25 DIAGNOSIS — R0982 Postnasal drip: Secondary | ICD-10-CM | POA: Insufficient documentation

## 2021-08-25 DIAGNOSIS — J069 Acute upper respiratory infection, unspecified: Secondary | ICD-10-CM | POA: Diagnosis not present

## 2021-08-25 DIAGNOSIS — U071 COVID-19: Secondary | ICD-10-CM | POA: Diagnosis not present

## 2021-08-25 DIAGNOSIS — R059 Cough, unspecified: Secondary | ICD-10-CM | POA: Diagnosis present

## 2021-08-25 LAB — SARS CORONAVIRUS 2 (TAT 6-24 HRS): SARS Coronavirus 2: POSITIVE — AB

## 2021-08-25 MED ORDER — PROMETHAZINE-DM 6.25-15 MG/5ML PO SYRP: 5.0000 mL | ORAL_SOLUTION | Freq: Four times a day (QID) | ORAL | 0 refills | Status: DC | PRN

## 2021-08-25 MED ORDER — IPRATROPIUM BROMIDE 0.06 % NA SOLN: 2.0000 | Freq: Four times a day (QID) | NASAL | 12 refills | Status: DC

## 2021-08-25 MED ORDER — BENZONATATE 100 MG PO CAPS: 200.0000 mg | ORAL_CAPSULE | Freq: Three times a day (TID) | ORAL | 0 refills | Status: DC

## 2021-08-25 NOTE — Discharge Instructions (Signed)
Isolate at home pending the results of your COVID test.  If you test positive then you will have to quarantine for 5 days from the start of your symptoms.  After 5 days you can break quarantine if your symptoms have improved and you have not had a fever for 24 hours without taking Tylenol or ibuprofen.  Use over-the-counter Tylenol and ibuprofen as needed for body aches and fever.  Use the Atrovent nasal spray, 2 squirts in each nostril every 6 hours, as needed for runny nose and postnasal drip.  Use the Tessalon Perles every 8 hours during the day.  Take them with a small sip of water.  They may give you some numbness to the base of your tongue or a metallic taste in your mouth, this is normal.  Use the Promethazine DM cough syrup at bedtime for cough and congestion.  It will make you drowsy so do not take it during the day.  If your COVID test is positive we can prescribe Paxlovid or molnupiravir.  If you develop any increased shortness of breath-especially at rest, you are unable to speak in full sentences, or is a late sign your lips are turning blue you need to go the ER for evaluation.

## 2021-08-25 NOTE — ED Provider Notes (Signed)
[facility]    CSN: [id number] Arrival date & time: 08/25/21  1012      History   Chief Complaint Chief Complaint  Patient presents with   Nasal Congestion    HPI Donna Welch is a 76 y.o. female.   HPI  76 year old female here for evaluation of respiratory complaints.  Patient reports that her symptoms started yesterday and they include nasal congestion and facial pressure.  She took a COVID test at home that was negative and a second 1 that was slightly positive.  She realized both of them are out of date and she is here for COVID test to be sure.  She denies any fever, runny nose, ear pain, sore throat, cough, shortness of breath or wheezing, GI complaints, headache, or body aches.  She does endorse some mild postnasal drip and has an occasional cough to clear her throat.  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      Home Medications    Prior to Admission medications   Medication Sig Start Date End Date Taking? Authorizing Provider  amLODipine (NORVASC) 5 MG tablet TAKE 1 TABLET BY MOUTH EVERY DAY  07/12/15  Yes Ashok Norris, MD  benzonatate (TESSALON) 100 MG capsule Take 2 capsules (200 mg total) by mouth every 8 (eight) hours. 08/25/21  Yes Margarette Canada, NP  fexofenadine-pseudoephedrine (ALLEGRA-D) 60-120 MG per tablet Take 1 tablet by mouth 2 (two) times daily. 01/08/15  Yes Ashok Norris, MD  fluticasone (FLONASE) 50 MCG/ACT nasal spray USE ONE SPRAY IN EACH NOSTRIL TWICE DAILY. 03/14/15  Yes Ashok Norris, MD  ipratropium (ATROVENT) 0.06 % nasal spray Place 2 sprays into both nostrils 4 (four) times daily. 08/25/21  Yes Margarette Canada, NP  losartan-hydrochlorothiazide (HYZAAR) 100-25 MG per tablet Take 1 tablet by mouth daily. 01/08/15  Yes Ashok Norris, MD  Multiple Vitamin (DAILY VITAMINS) tablet Take by mouth. 12/06/02  Yes [provider]  omeprazole (PRILOSEC) 40 MG capsule TAKE 1 CAPSULE BY MOUTH EVERY DAY 07/31/15  Yes Ashok Norris, MD  oxybutynin (DITROPAN-XL) 10 MG 24 hr tablet Take 1 tablet (10 mg total) by mouth daily. 01/08/15  Yes Ashok Norris, MD  oxybutynin (DITROPAN-XL) 10 MG 24 hr tablet Take 1 tablet (10 mg total) by mouth daily. 01/08/15  Yes Ashok Norris, MD  promethazine-dextromethorphan (PROMETHAZINE-DM) 6.25-15 MG/5ML syrup Take 5 mLs by mouth 4 (four) times daily as needed. 08/25/21  Yes Margarette Canada, NP  traZODone (DESYREL) 50 MG tablet TAKE 1 TABLET BY MOUTH NIGHTLY 05/01/19  Yes [provider]  pantoprazole (PROTONIX) 40 MG tablet Take by mouth. 03/30/19 03/29/20  [provider]  pravastatin (PRAVACHOL) 20 MG tablet Take by mouth. 03/07/19 03/06/20  [provider]  telmisartan-hydrochlorothiazide (MICARDIS HCT) 80-25 MG tablet Take by mouth. 11/15/18 11/15/19  [provider]    Family History Family History  Problem Relation Age of Onset   Diabetes Mother    Hypertension Mother    Breast cancer Neg Hx     Social History Social History   Tobacco Use   Smoking status: Former    Packs/day: 0.50     Types: Cigarettes   Smokeless tobacco: Never   Tobacco comments:    quit for a year then started back after mother died.  Substance Use Topics   Alcohol use: Yes    Alcohol/week: 14.0 standard drinks    Types: 14 Standard drinks or equivalent per week   Drug use: No     Allergies   Hydrocodone-acetaminophen and Vicodin [hydrocodone-acetaminophen]   Review of Systems Review of Systems  Constitutional:  Negative for activity change, appetite change and fever.  HENT:  Positive for congestion and postnasal drip. Negative for ear pain, rhinorrhea and sore throat.   Respiratory:  Negative for cough, shortness of breath and wheezing.   Gastrointestinal:  Negative for diarrhea, nausea and vomiting.  Musculoskeletal:  Negative for arthralgias and myalgias.  Skin:  Negative for rash.  Neurological:  Negative for headaches.  Hematological: Negative.   Psychiatric/Behavioral: Negative.      Physical Exam Triage Vital Signs ED Triage Vitals  Enc Vitals Group     BP 08/25/21 1100 132/77     Pulse Rate 08/25/21 1100 (!) 51     Resp 08/25/21 1100 18     Temp 08/25/21 1100 99 F (37.2 C)     Temp Source 08/25/21 1100 Oral     SpO2 08/25/21 1100 96 %     Weight 08/25/21 1057 158 lb (71.7 kg)     Height 08/25/21 1057 5\' 5"  (1.651 m)     Head Circumference --      Peak Flow --      Pain Score 08/25/21 1055 0     Pain Loc --      Pain Edu? --      Excl. in [place]? --    No data found.  Updated Vital Signs BP 132/77 (BP Location: Right Arm)    Pulse (!) 51    Temp 99 F (37.2 C) (Oral)    Resp 18    Ht 5\' 5"  (1.651 m)    Wt 158 lb (71.7 kg)    SpO2 96%    BMI 26.29 kg/m   Visual Acuity Right Eye Distance:   Left Eye Distance:   Bilateral Distance:    Right Eye Near:   Left Eye Near:    Bilateral Near:     Physical Exam Vitals and nursing note reviewed.  Constitutional:      Appearance: Normal appearance. She is not ill-appearing.  HENT:     Head: Normocephalic and  atraumatic.     Right Ear: Tympanic membrane, ear canal and external ear normal. There is no impacted cerumen.     Left Ear: Tympanic membrane, ear canal and external ear normal. There is no impacted cerumen.     Nose: Congestion and rhinorrhea present.     Mouth/Throat:     Mouth: Mucous membranes are moist.     Pharynx: Oropharynx is clear. No posterior oropharyngeal erythema.  Cardiovascular:     Rate and Rhythm: Normal rate and regular rhythm.  Pulses: Normal pulses.     Heart sounds: Normal heart sounds. No murmur heard.   No friction rub. No gallop.  Pulmonary:     Effort: Pulmonary effort is normal.     Breath sounds: Normal breath sounds. No wheezing, rhonchi or rales.  Musculoskeletal:     Cervical back: Normal range of motion and neck supple.  Lymphadenopathy:     Cervical: No cervical adenopathy.  Skin:    General: Skin is warm and dry.     Capillary Refill: Capillary refill takes less than 2 seconds.     Findings: No erythema or rash.  Neurological:     General: No focal deficit present.     Mental Status: She is alert and oriented to person, place, and time.  Psychiatric:        Mood and Affect: Mood normal.        Behavior: Behavior normal.        Thought Content: Thought content normal.        Judgment: Judgment normal.     UC Treatments / Results  Labs (all labs ordered are listed, but only abnormal results are displayed) Labs Reviewed  SARS CORONAVIRUS 2 (TAT 6-24 HRS)    EKG   Radiology No results found.  Procedures Procedures (including critical care time)  Medications Ordered in UC Medications - No data to display  Initial Impression / Assessment and Plan / UC Course  I have reviewed the triage vital signs and the nursing notes.  Pertinent labs & imaging results that were available during my care of the patient were reviewed by me and considered in my medical decision making (see chart for details).  Patient is a very pleasant,  nontoxic-appearing 62 old female here for evaluation of nasal congestion and facial pressure with postnasal drip that began yesterday.  She took 2 at home COVID tests, 1 that was positive faintly and 1 that was negative, and is here for confirmation testing.  She states both of her test were out of date after she took them.  Patient's physical exam reveals pearly-gray tympanic membranes bilaterally with normal light reflex and clear external auditory canals.  Nasal mucosa is mildly erythematous and edematous with scant clear discharge in both nares.  Oropharyngeal exam is benign.  No cervical adenopathy present on exam.  Cardiopulmonary exam shows clear lung sounds in all fields.  Will swab patient for COVID and discharged home to isolate.  I have given her Atrovent nasal spray to help her with her nasal congestion and also prescribe Tessalon Perles and Promethazine DM cough syrup as needed.  She states she is not having much in the way of cough and she has been using over-the-counter TheraFlu with good results.  Because the patient has a history of hypertension, atherosclerosis, high cholesterol, and is over the age of 89 she is eligible for antiviral therapy.  CMP in epic from [facility] on 07/19/2021 shows a GFR of 85.  If patient test positive I will prescribe Paxlovid and have her hold her protocol while she is taking it.  I given her a work note to cover her for the isolation duration.   Final Clinical Impressions(s) / UC Diagnoses   Final diagnoses:  Viral URI with cough     Discharge Instructions      Isolate at home pending the results of your COVID test.  If you test positive then you will have to quarantine for 5 days from the start of your symptoms.  After 5 days you can break quarantine if your symptoms have improved and you have not had a fever for 24 hours without taking Tylenol or ibuprofen.  Use over-the-counter Tylenol and ibuprofen as needed for body aches and fever.  Use the  Atrovent nasal spray, 2 squirts in each nostril every 6 hours, as needed for runny nose and postnasal drip.  Use the Tessalon Perles every 8 hours during the day.  Take them with a small sip of water.  They may give you some numbness to the base of your tongue or a metallic taste in your mouth, this is normal.  Use the Promethazine DM cough syrup at bedtime for cough and congestion.  It will make you drowsy so do not take it during the day.  If your COVID test is positive we can prescribe Paxlovid or molnupiravir.  If you develop any increased shortness of breath-especially at rest, you are unable to speak in full sentences, or is a late sign your lips are turning blue you need to go the ER for evaluation.      ED Prescriptions     Medication Sig Dispense Auth. Provider   benzonatate (TESSALON) 100 MG capsule Take 2 capsules (200 mg total) by mouth every 8 (eight) hours. 21 capsule Margarette Canada, NP   ipratropium (ATROVENT) 0.06 % nasal spray Place 2 sprays into both nostrils 4 (four) times daily. 15 mL Margarette Canada, NP   promethazine-dextromethorphan (PROMETHAZINE-DM) 6.25-15 MG/5ML syrup Take 5 mLs by mouth 4 (four) times daily as needed. 118 mL Margarette Canada, NP      PDMP not reviewed this encounter.   Margarette Canada, NP 08/25/21 1232

## 2021-08-25 NOTE — ED Triage Notes (Signed)
Pt here with C/O nasal congestion, facial pressure since yesterday. Would like Covid test to be sure. States she took 2 at home 1 was negative and 1 was slight positive. NO fever, sore throat, chest congestion.

## 2021-08-26 ENCOUNTER — Telehealth (HOSPITAL_COMMUNITY): Payer: Self-pay | Admitting: Emergency Medicine

## 2021-08-26 MED ORDER — NIRMATRELVIR/RITONAVIR (PAXLOVID)TABLET: 3.0000 | ORAL_TABLET | Freq: Two times a day (BID) | ORAL | 0 refills | Status: AC

## 2021-12-23 ENCOUNTER — Ambulatory Visit: Payer: Medicare Other | Admitting: Anesthesiology

## 2021-12-23 ENCOUNTER — Encounter: Payer: Self-pay | Admitting: *Deleted

## 2021-12-23 ENCOUNTER — Ambulatory Visit
Admission: RE | Admit: 2021-12-23 | Discharge: 2021-12-23 | Disposition: A | Discharge status: Home / Self Care | Payer: Medicare Other | Attending: Gastroenterology | Admitting: Gastroenterology

## 2021-12-23 ENCOUNTER — Encounter
Admission: RE | Disposition: A | Discharge status: Home / Self Care | Payer: Self-pay | Source: Home / Self Care | Attending: Gastroenterology

## 2021-12-23 DIAGNOSIS — I10 Essential (primary) hypertension: Secondary | ICD-10-CM | POA: Insufficient documentation

## 2021-12-23 DIAGNOSIS — Z86010 Personal history of colonic polyps: Secondary | ICD-10-CM | POA: Insufficient documentation

## 2021-12-23 DIAGNOSIS — Z1211 Encounter for screening for malignant neoplasm of colon: Secondary | ICD-10-CM | POA: Diagnosis present

## 2021-12-23 DIAGNOSIS — K219 Gastro-esophageal reflux disease without esophagitis: Secondary | ICD-10-CM | POA: Insufficient documentation

## 2021-12-23 DIAGNOSIS — Z87891 Personal history of nicotine dependence: Secondary | ICD-10-CM | POA: Insufficient documentation

## 2021-12-23 HISTORY — PX: COLONOSCOPY WITH PROPOFOL: SHX5780

## 2021-12-23 SURGERY — COLONOSCOPY WITH PROPOFOL
Anesthesia: General

## 2021-12-23 MED ORDER — LIDOCAINE HCL (PF) 2 % IJ SOLN
INTRAMUSCULAR | Status: AC
  Filled 2021-12-23: qty 5

## 2021-12-23 MED ORDER — PROPOFOL 1000 MG/100ML IV EMUL
INTRAVENOUS | Status: AC
  Filled 2021-12-23: qty 200

## 2021-12-23 MED ORDER — PROPOFOL 10 MG/ML IV BOLUS
INTRAVENOUS | Status: DC | PRN
  Administered 2021-12-23: 50 mg via INTRAVENOUS

## 2021-12-23 MED ORDER — SODIUM CHLORIDE 0.9 % IV SOLN: INTRAVENOUS | Status: DC

## 2021-12-23 MED ORDER — LIDOCAINE HCL (CARDIAC) PF 100 MG/5ML IV SOSY
PREFILLED_SYRINGE | INTRAVENOUS | Status: DC | PRN
  Administered 2021-12-23: 50 mg via INTRAVENOUS

## 2021-12-23 MED ORDER — PROPOFOL 500 MG/50ML IV EMUL
INTRAVENOUS | Status: DC | PRN
  Administered 2021-12-23: 200 ug/kg/min via INTRAVENOUS

## 2021-12-23 NOTE — Interval H&P Note (Signed)
History and Physical Interval Note:  12/23/2021 8:28 AM  Donna Welch  has presented today for surgery, with the diagnosis of PH Adenomatous Polyps.  The various methods of treatment have been discussed with the patient and family. After consideration of risks, benefits and other options for treatment, the patient has consented to  Procedure(s): COLONOSCOPY WITH PROPOFOL (N/A) as a surgical intervention.  The patient's history has been reviewed, patient examined, no change in status, stable for surgery.  I have reviewed the patient's chart and labs.  Questions were answered to the patient's satisfaction.     Regis Bill  Ok to proceed with colonoscopy

## 2021-12-23 NOTE — Transfer of Care (Signed)
Immediate Anesthesia Transfer of Care Note  Patient: Donna Welch  Procedure(s) Performed: COLONOSCOPY WITH PROPOFOL  Patient Location: PACU  Anesthesia Type:General  Level of Consciousness: drowsy  Airway & Oxygen Therapy: Patient Spontanous Breathing  Post-op Assessment: Report given to RN and Post -op Vital signs reviewed and stable  Post vital signs: Reviewed and stable  Last Vitals:  Vitals Value Taken Time  BP 124/64 12/23/21 0857  Temp    Pulse 62 12/23/21 0856  Resp 19 12/23/21 0857  SpO2 97 % 12/23/21 0856  Vitals shown include unvalidated device data.  Last Pain:  Vitals:   12/23/21 0746  TempSrc: Temporal  PainSc: 0-No pain         Complications: No notable events documented.

## 2021-12-23 NOTE — Op Note (Signed)
[facility] Patient Name: Donna Welch Procedure Date: 12/23/2021 8:23 AM MRN: [id number] Account #: [id number] Date of Birth: [birth year] Admit Type: Outpatient Age: 76 Room: [facility] ENDO ROOM 3 Gender: Female Note Status: Finalized Instrument Name: Jasper Riling [id number] Procedure:             Colonoscopy Indications:           Surveillance: Personal history of adenomatous polyps                         on last colonoscopy > 3 years ago Providers:             Andrey Farmer MD, MD Referring MD:          Leonie Douglas. Doy Hutching, MD (Referring MD) Medicines:             Monitored Anesthesia Care Complications:         No immediate complications. Procedure:             Pre-Anesthesia Assessment:                        - Prior to the procedure, a History and Physical was                         performed, and patient medications and allergies were                         reviewed. The patient is competent. The risks and                         benefits of the procedure and the sedation options and                         risks were discussed with the patient. All questions                         were answered and informed consent was obtained.                         Patient identification and proposed procedure were                         verified by the physician, the nurse, the                         anesthesiologist, the anesthetist and the technician                         in the endoscopy suite. Mental Status Examination:                         alert and oriented. Airway Examination: normal                         oropharyngeal airway and neck mobility. Respiratory                         Examination: clear to auscultation. CV Examination:  normal. Prophylactic Antibiotics: The patient does not                         require prophylactic antibiotics. Prior                         Anticoagulants: The patient has taken no  previous                         anticoagulant or antiplatelet agents. ASA Grade                         Assessment: II - A patient with mild systemic disease.                         After reviewing the risks and benefits, the patient                         was deemed in satisfactory condition to undergo the                         procedure. The anesthesia plan was to use monitored                         anesthesia care (MAC). Immediately prior to                         administration of medications, the patient was                         re-assessed for adequacy to receive sedatives. The                         heart rate, respiratory rate, oxygen saturations,                         blood pressure, adequacy of pulmonary ventilation, and                         response to care were monitored throughout the                         procedure. The physical status of the patient was                         re-assessed after the procedure.                        After obtaining informed consent, the colonoscope was                         passed under direct vision. Throughout the procedure,                         the patient's blood pressure, pulse, and oxygen                         saturations were monitored continuously. The  Colonoscope was introduced through the anus and                         advanced to the the cecum, identified by appendiceal                         orifice and ileocecal valve. The colonoscopy was                         somewhat difficult due to significant looping.                         Successful completion of the procedure was aided by                         applying abdominal pressure. The patient tolerated the                         procedure well. The quality of the bowel preparation                         was good. Findings:      The perianal and digital rectal examinations were normal.      The entire examined colon  appeared normal on direct and retroflexion       views. Impression:            - The entire examined colon is normal on direct and                         retroflexion views.                        - No specimens collected. Recommendation:        - Discharge patient to home.                        - Resume previous diet.                        - Continue present medications.                        - Repeat colonoscopy is not recommended due to current                         age (97 years or older) for surveillance.                        - Return to referring physician as previously                         scheduled. Procedure Code(s):     --- Professional ---                        I6270, Colorectal cancer screening; colonoscopy on                         individual at high risk Diagnosis Code(s):     --- Professional ---  Z86.010, Personal history of colonic polyps CPT copyright 2019 [employer]. All rights reserved. The codes documented in this report are preliminary and upon coder review may  be revised to meet current compliance requirements. Andrey Farmer MD, MD 12/23/2021 8:54:42 AM Number of Addenda: 0 Note Initiated On: 12/23/2021 8:23 AM Scope Withdrawal Time: 0 hours 7 minutes 41 seconds  Total Procedure Duration: 0 hours 15 minutes 3 seconds  Estimated Blood Loss:  Estimated blood loss: none.      [facility]

## 2021-12-23 NOTE — Anesthesia Preprocedure Evaluation (Signed)
Anesthesia Evaluation  Patient identified by MRN, date of birth, ID band Patient awake    Reviewed: Allergy & Precautions, NPO status , Patient's Chart, lab work & pertinent test results  History of Anesthesia Complications (+) DIFFICULT IV STICK / SPECIAL LINE and history of anesthetic complications  Airway Mallampati: III  TM Distance: >3 FB Neck ROM: full    Dental  (+) Chipped, Poor Dentition, Missing   Pulmonary neg shortness of breath, former smoker,    Pulmonary exam normal        Cardiovascular Exercise Tolerance: Good hypertension, (-) anginaNormal cardiovascular exam     Neuro/Psych negative neurological ROS  negative psych ROS   GI/Hepatic Neg liver ROS, GERD  Controlled,  Endo/Other  negative endocrine ROS  Renal/GU negative Renal ROS  negative genitourinary   Musculoskeletal   Abdominal   Peds  Hematology negative hematology ROS (+)   Anesthesia Other Findings Past Medical History: No date: Allergy No date: GERD (gastroesophageal reflux disease) No date: Hyperlipidemia No date: Hypertension  Past Surgical History: No date: ABDOMINAL HYSTERECTOMY No date: COLONOSCOPY; Bilateral  BMI    Body Mass Index: 30.27 kg/m      Reproductive/Obstetrics negative OB ROS                             Anesthesia Physical Anesthesia Plan  ASA: 2  Anesthesia Plan: General   Post-op Pain Management:    Induction: Intravenous  PONV Risk Score and Plan: Propofol infusion and TIVA  Airway Management Planned: Natural Airway and Nasal Cannula  Additional Equipment:   Intra-op Plan:   Post-operative Plan:   Informed Consent: I have reviewed the patients History and Physical, chart, labs and discussed the procedure including the risks, benefits and alternatives for the proposed anesthesia with the patient or authorized representative who has indicated his/her understanding and  acceptance.     Dental Advisory Given  Plan Discussed with: Anesthesiologist, CRNA and Surgeon  Anesthesia Plan Comments: (Patient consented for risks of anesthesia including but not limited to:  - adverse reactions to medications - risk of airway placement if required - damage to eyes, teeth, lips or other oral mucosa - nerve damage due to positioning  - sore throat or hoarseness - Damage to heart, brain, nerves, lungs, other parts of body or loss of life  Patient voiced understanding.)        Anesthesia Quick Evaluation

## 2021-12-23 NOTE — Anesthesia Postprocedure Evaluation (Signed)
Anesthesia Post Note  Patient: Donna Welch  Procedure(s) Performed: COLONOSCOPY WITH PROPOFOL  Patient location during evaluation: Endoscopy Anesthesia Type: General Level of consciousness: awake and alert Pain management: pain level controlled Vital Signs Assessment: post-procedure vital signs reviewed and stable Respiratory status: spontaneous breathing, nonlabored ventilation, respiratory function stable and patient connected to nasal cannula oxygen Cardiovascular status: blood pressure returned to baseline and stable Postop Assessment: no apparent nausea or vomiting Anesthetic complications: no   No notable events documented.   Last Vitals:  Vitals:   12/23/21 0857 12/23/21 0917  BP: 124/64 107/75  Pulse:    Resp:    Temp: (!) 36.4 C   SpO2:      Last Pain:  Vitals:   12/23/21 0917  TempSrc:   PainSc: 0-No pain                 Precious Haws Dontasia Miranda

## 2021-12-23 NOTE — H&P (Signed)
Outpatient short stay form Pre-procedure 12/23/2021  Regis Bill, MD  Primary Physician: Marguarite Arbour, MD  Reason for visit:  Surveillance colonoscopy  History of present illness:    76 y/o lady with history of hypertension here for surveillance colonoscopy. Last colonoscopy in 2019 with several Ta's. No blood thinners. No family history of GI malignancies. History of hysterectomy and appendectomy.    Current Facility-Administered Medications:    0.9 %  sodium chloride infusion, , Intravenous, Continuous, Adasyn Mcadams, Rossie Muskrat, MD, Last Rate: 20 mL/hr at 12/23/21 0813, New Bag at 12/23/21 0813  Medications Prior to Admission  Medication Sig Dispense Refill Last Dose   amLODipine (NORVASC) 5 MG tablet TAKE 1 TABLET BY MOUTH EVERY DAY 90 tablet 0 12/22/2021   losartan-hydrochlorothiazide (HYZAAR) 100-25 MG per tablet Take 1 tablet by mouth daily. 90 tablet 1 12/22/2021   omeprazole (PRILOSEC) 40 MG capsule TAKE 1 CAPSULE BY MOUTH EVERY DAY 90 capsule 0 12/22/2021   oxybutynin (DITROPAN-XL) 10 MG 24 hr tablet Take 1 tablet (10 mg total) by mouth daily. 60 tablet 0 12/22/2021   benzonatate (TESSALON) 100 MG capsule Take 2 capsules (200 mg total) by mouth every 8 (eight) hours. 21 capsule 0    fexofenadine-pseudoephedrine (ALLEGRA-D) 60-120 MG per tablet Take 1 tablet by mouth 2 (two) times daily. 30 tablet 5    fluticasone (FLONASE) 50 MCG/ACT nasal spray USE ONE SPRAY IN EACH NOSTRIL TWICE DAILY. 1 g 11    ipratropium (ATROVENT) 0.06 % nasal spray Place 2 sprays into both nostrils 4 (four) times daily. 15 mL 12    Multiple Vitamin (DAILY VITAMINS) tablet Take by mouth.      oxybutynin (DITROPAN-XL) 10 MG 24 hr tablet Take 1 tablet (10 mg total) by mouth daily. 30 tablet 5    pantoprazole (PROTONIX) 40 MG tablet Take by mouth.      pravastatin (PRAVACHOL) 20 MG tablet Take by mouth.      promethazine-dextromethorphan (PROMETHAZINE-DM) 6.25-15 MG/5ML syrup Take 5 mLs by mouth 4 (four)  times daily as needed. 118 mL 0    telmisartan-hydrochlorothiazide (MICARDIS HCT) 80-25 MG tablet Take by mouth.      traZODone (DESYREL) 50 MG tablet TAKE 1 TABLET BY MOUTH NIGHTLY        Allergies  Allergen Reactions   Hydrocodone-Acetaminophen Other (See Comments)    Other reaction(s): Other (See Comments) High blood pressure hyperactivity    Vicodin [Hydrocodone-Acetaminophen] Other (See Comments)    hyperactivity     Past Medical History:  Diagnosis Date   Allergy    GERD (gastroesophageal reflux disease)    Hyperlipidemia    Hypertension     Review of systems:  Otherwise negative.    Physical Exam  Gen: Alert, oriented. Appears stated age.  HEENT: PERRLA. Lungs: No respiratory distress CV: RRR Abd: soft, benign, no masses Ext: No edema    Planned procedures: Proceed with colonoscopy. The patient understands the nature of the planned procedure, indications, risks, alternatives and potential complications including but not limited to bleeding, infection, perforation, damage to internal organs and possible oversedation/side effects from anesthesia. The patient agrees and gives consent to proceed.  Please refer to procedure notes for findings, recommendations and patient disposition/instructions.     Regis Bill, MD [facility]

## 2021-12-24 ENCOUNTER — Encounter: Payer: Self-pay | Admitting: Gastroenterology

## 2022-01-16 ENCOUNTER — Other Ambulatory Visit: Payer: Self-pay | Admitting: Internal Medicine

## 2022-01-16 DIAGNOSIS — Z1231 Encounter for screening mammogram for malignant neoplasm of breast: Secondary | ICD-10-CM

## 2022-01-21 ENCOUNTER — Ambulatory Visit
Admission: RE | Admit: 2022-01-21 | Discharge: 2022-01-21 | Disposition: A | Discharge status: Home / Self Care | Payer: Medicare Other | Source: Ambulatory Visit | Attending: Internal Medicine | Admitting: Internal Medicine

## 2022-01-21 DIAGNOSIS — Z1231 Encounter for screening mammogram for malignant neoplasm of breast: Secondary | ICD-10-CM | POA: Diagnosis not present

## 2022-01-27 ENCOUNTER — Other Ambulatory Visit: Payer: Self-pay | Admitting: Internal Medicine

## 2022-01-27 DIAGNOSIS — R079 Chest pain, unspecified: Secondary | ICD-10-CM

## 2022-01-29 ENCOUNTER — Telehealth (HOSPITAL_COMMUNITY): Payer: Self-pay | Admitting: *Deleted

## 2022-01-29 NOTE — Telephone Encounter (Signed)
Reaching out to patient to offer assistance regarding upcoming cardiac imaging study; pt verbalizes understanding of appt date/time, parking situation and where to check in, pre-test NPO status, and verified current allergies; name and call back number provided for further questions should they arise  Donna Brick RN Navigator Cardiac Imaging [facility] [phone] office [phone] cell  Patient confirms HR is 60bpm.

## 2022-01-30 ENCOUNTER — Other Ambulatory Visit: Payer: Self-pay | Admitting: Internal Medicine

## 2022-01-30 ENCOUNTER — Ambulatory Visit
Admission: RE | Admit: 2022-01-30 | Discharge: 2022-01-30 | Disposition: A | Discharge status: Home / Self Care | Payer: Medicare Other | Source: Ambulatory Visit | Attending: Internal Medicine | Admitting: Internal Medicine

## 2022-01-30 DIAGNOSIS — I2510 Atherosclerotic heart disease of native coronary artery without angina pectoris: Secondary | ICD-10-CM | POA: Insufficient documentation

## 2022-01-30 DIAGNOSIS — R079 Chest pain, unspecified: Secondary | ICD-10-CM | POA: Diagnosis present

## 2022-01-30 DIAGNOSIS — R931 Abnormal findings on diagnostic imaging of heart and coronary circulation: Secondary | ICD-10-CM

## 2022-01-30 DIAGNOSIS — I7121 Aneurysm of the ascending aorta, without rupture: Secondary | ICD-10-CM | POA: Insufficient documentation

## 2022-01-30 MED ORDER — NITROGLYCERIN 0.4 MG SL SUBL
0.8000 mg | SUBLINGUAL_TABLET | Freq: Once | SUBLINGUAL | Status: AC
  Administered 2022-01-30: 0.8 mg via SUBLINGUAL

## 2022-01-30 MED ORDER — IOHEXOL 350 MG/ML SOLN
75.0000 mL | Freq: Once | INTRAVENOUS | Status: AC | PRN
  Administered 2022-01-30: 75 mL via INTRAVENOUS

## 2022-01-30 NOTE — Progress Notes (Signed)
Patient had a cardiac CT drank water after. Vital signs stable encourage to drink water throughout day.Reasons explained and verbalized understanding. Ambulated steady gait.

## 2022-01-31 DIAGNOSIS — R931 Abnormal findings on diagnostic imaging of heart and coronary circulation: Secondary | ICD-10-CM | POA: Diagnosis not present

## 2022-03-24 ENCOUNTER — Emergency Department
Admission: EM | Admit: 2022-03-24 | Discharge: 2022-03-24 | Disposition: A | Discharge status: Home / Self Care | Payer: Medicare Other | Attending: Emergency Medicine | Admitting: Emergency Medicine

## 2022-03-24 ENCOUNTER — Emergency Department: Payer: Medicare Other

## 2022-03-24 ENCOUNTER — Other Ambulatory Visit: Payer: Self-pay

## 2022-03-24 DIAGNOSIS — R0602 Shortness of breath: Secondary | ICD-10-CM | POA: Insufficient documentation

## 2022-03-24 DIAGNOSIS — R079 Chest pain, unspecified: Secondary | ICD-10-CM

## 2022-03-24 DIAGNOSIS — R001 Bradycardia, unspecified: Secondary | ICD-10-CM | POA: Insufficient documentation

## 2022-03-24 DIAGNOSIS — R0789 Other chest pain: Secondary | ICD-10-CM | POA: Insufficient documentation

## 2022-03-24 DIAGNOSIS — I10 Essential (primary) hypertension: Secondary | ICD-10-CM | POA: Diagnosis not present

## 2022-03-24 LAB — TROPONIN I (HIGH SENSITIVITY)
Troponin I (High Sensitivity): 5 ng/L (ref <18)
Troponin I (High Sensitivity): 5 ng/L (ref <18)

## 2022-03-24 LAB — CBC WITH DIFFERENTIAL/PLATELET
Abs Immature Granulocytes: 0.02 10*3/uL (ref 0.00–0.07)
Basophils Absolute: 0.1 10*3/uL (ref 0.0–0.1)
Basophils Relative: 1 %
Eosinophils Absolute: 0.3 10*3/uL (ref 0.0–0.5)
Eosinophils Relative: 4 %
HCT: 38.4 % (ref 36.0–46.0)
Hemoglobin: 12.6 g/dL (ref 12.0–15.0)
Immature Granulocytes: 0 %
Lymphocytes Relative: 32 %
Lymphs Abs: 2.8 10*3/uL (ref 0.7–4.0)
MCH: 32.1 pg (ref 26.0–34.0)
MCHC: 32.8 g/dL (ref 30.0–36.0)
MCV: 98 fL (ref 80.0–100.0)
Monocytes Absolute: 0.6 10*3/uL (ref 0.1–1.0)
Monocytes Relative: 7 %
Neutro Abs: 4.9 10*3/uL (ref 1.7–7.7)
Neutrophils Relative %: 56 %
Platelets: 222 10*3/uL (ref 150–400)
RBC: 3.92 MIL/uL (ref 3.87–5.11)
RDW: 11.6 % (ref 11.5–15.5)
WBC: 8.6 10*3/uL (ref 4.0–10.5)
nRBC: 0 % (ref 0.0–0.2)

## 2022-03-24 LAB — BASIC METABOLIC PANEL
Anion gap: 10 (ref 5–15)
BUN: 14 mg/dL (ref 8–23)
CO2: 26 mmol/L (ref 22–32)
Calcium: 9.2 mg/dL (ref 8.9–10.3)
Chloride: 106 mmol/L (ref 98–111)
Creatinine, Ser: 0.95 mg/dL (ref 0.44–1.00)
GFR, Estimated: >60 mL/min (ref >60)
Glucose, Bld: 98 mg/dL (ref 70–99)
Potassium: 3.8 mmol/L (ref 3.5–5.1)
Sodium: 142 mmol/L (ref 135–145)

## 2022-03-24 NOTE — ED Triage Notes (Signed)
Pt arrives from home via EMS w/ c/o CP and SOB that started around 0530 when she got up to use the restroom. Pt was given 324 asa and x2 nitro sprays and CP/SOB have resolved. Pt is AO4/GCS15, VSS, NAD at this time.   Hx htn

## 2022-03-24 NOTE — ED Notes (Signed)
Pt resting on stretcher at this time, watching TV. Respirations are even and unlabored, skin AfE/WD. CP/SOB continues to be resolved at this time. No needs expressed. Call light is in reach, [facility]. Family at bedside.

## 2022-03-24 NOTE — ED Provider Notes (Signed)
[facility] Provider Note    Event Date/Time   First MD Initiated Contact with Patient 03/24/22 0805     (approximate)   History   Chief Complaint Chest Pain and Shortness of Breath   HPI  Donna Welch is a 76 y.o. female with past medical history of hypertension, hyperlipidemia, and GERD who presents to the ED complaining of chest pain.  Patient reports that she woke up around 530 this morning to go to the bathroom and started to notice squeezing pain in the left side of her chest.  Pain seemed to gradually worsen over the course of the morning, but was not exacerbated or alleviated by anything in particular.  She felt slightly short of breath with the pain, but states she was feeling fine when she went to bed last night with no recent fevers, cough, leg swelling or pain.  She has had similar episodes of chest pain in the past, but never as severe as this morning.  EMS was called and she was given 324 mg of aspirin as well as 2 sprays of nitroglycerin, symptoms have since resolved.     Physical Exam   Triage Vital Signs: ED Triage Vitals [03/24/22 0804]  Enc Vitals Group     BP      Pulse      Resp      Temp      Temp src      SpO2 98 %     Weight      Height      Head Circumference      Peak Flow      Pain Score      Pain Loc      Pain Edu?      Excl. in [place]?     Most recent vital signs: Vitals:   03/24/22 0907 03/24/22 0946  BP: 128/72 (!) 149/115  Pulse: (!) 55 (!) 57  Resp:  19  Temp:    SpO2: 98% 96%    Constitutional: Alert and oriented. Eyes: Conjunctivae are normal. Head: Atraumatic. Nose: No congestion/rhinnorhea. Mouth/Throat: Mucous membranes are moist.  Cardiovascular: Normal rate, regular rhythm. Grossly normal heart sounds.  2+ radial pulses bilaterally. Respiratory: Normal respiratory effort.  No retractions. Lungs CTAB.  No chest wall tenderness to palpation. Gastrointestinal: Soft and nontender. No  distention. Musculoskeletal: No lower extremity tenderness nor edema.  Neurologic:  Normal speech and language. No gross focal neurologic deficits are appreciated.    ED Results / Procedures / Treatments   Labs (all labs ordered are listed, but only abnormal results are displayed) Labs Reviewed  CBC WITH DIFFERENTIAL/PLATELET  BASIC METABOLIC PANEL  TROPONIN I (HIGH SENSITIVITY)  TROPONIN I (HIGH SENSITIVITY)     EKG  ED ECG REPORT I, Chesley Noon, the attending physician, personally viewed and interpreted this ECG.   Date: 03/24/2022  EKG Time: 8:09  Rate: 55  Rhythm: sinus bradycardia  Axis: Normal  Intervals:none  ST&T Change: None  RADIOLOGY Chest x-ray reviewed and interpreted by me with no infiltrate, edema, or effusion.  PROCEDURES:  Critical Care performed: No  Procedures   MEDICATIONS ORDERED IN ED: Medications - No data to display   IMPRESSION / MDM / ASSESSMENT AND PLAN / ED COURSE  I reviewed the triage vital signs and the nursing notes.  76 y.o. female with past medical history of hypertension, hyperlipidemia, and GERD who presents to the ED complaining of episode of squeezing pain in her chest starting at 530 this morning, since resolved with aspirin and nitroglycerin.  Patient's presentation is most consistent with acute presentation with potential threat to life or bodily function.  Differential diagnosis includes, but is not limited to, ACS, PE, dissection, pneumonia, pneumothorax, musculoskeletal pain, GERD, anxiety.  Patient nontoxic-appearing and in no acute distress, vital signs remarkable for mild bradycardia and elevated blood pressure.  Patient states that pain has resolved following aspirin and nitroglycerin, initial EKG shows sinus bradycardia with no ischemic changes.  Patient did have coronary CTA performed at the end of July concerning for significant stenosis in the right coronary and left circumflex,  cardiac catheterization recommended at that time.  She has not yet followed up with cardiology for this.  We will observe on cardiac monitor, check 2 sets of troponin as well as chest x-ray, CBC, BMP.  Labs are reassuring with no significant anemia, leukocytosis, electrolyte abnormality, or AKI.  2 sets of troponin are within normal limits and patient remains chest pain-free at this time.  Chest x-ray is also unremarkable.  Patient is appropriate for discharge home, was counseled on close follow-up with her cardiologist given abnormal cardiac CTA with ongoing chest pain.  She was counseled to return to the ED for new or worsening symptoms, patient agrees with plan.      FINAL CLINICAL IMPRESSION(S) / ED DIAGNOSES   Final diagnoses:  Chest pain, unspecified type     Rx / DC Orders   ED Discharge Orders          Ordered    Ambulatory referral to Cardiology        03/24/22 1126             Note:  This document was prepared using Dragon voice recognition software and may include unintentional dictation errors.   Chesley Noon, MD 03/24/22 1126

## 2022-04-26 DIAGNOSIS — I25118 Atherosclerotic heart disease of native coronary artery with other forms of angina pectoris: Secondary | ICD-10-CM | POA: Diagnosis present

## 2022-04-26 DIAGNOSIS — I2542 Coronary artery dissection: Secondary | ICD-10-CM | POA: Insufficient documentation

## 2022-04-26 DIAGNOSIS — I480 Paroxysmal atrial fibrillation: Secondary | ICD-10-CM | POA: Insufficient documentation

## 2022-04-26 DIAGNOSIS — I9581 Postprocedural hypotension: Secondary | ICD-10-CM | POA: Insufficient documentation

## 2022-05-10 ENCOUNTER — Encounter: Payer: Self-pay | Admitting: Emergency Medicine

## 2022-05-10 ENCOUNTER — Other Ambulatory Visit: Payer: Self-pay

## 2022-05-10 ENCOUNTER — Emergency Department: Payer: Medicare Other

## 2022-05-10 ENCOUNTER — Observation Stay
Admission: EM | Admit: 2022-05-10 | Discharge: 2022-05-11 | Disposition: A | Discharge status: Home / Self Care | Payer: Medicare Other | Attending: Family Medicine | Admitting: Family Medicine

## 2022-05-10 DIAGNOSIS — I10 Essential (primary) hypertension: Secondary | ICD-10-CM | POA: Diagnosis not present

## 2022-05-10 DIAGNOSIS — I25118 Atherosclerotic heart disease of native coronary artery with other forms of angina pectoris: Secondary | ICD-10-CM | POA: Diagnosis present

## 2022-05-10 DIAGNOSIS — R001 Bradycardia, unspecified: Secondary | ICD-10-CM | POA: Diagnosis present

## 2022-05-10 DIAGNOSIS — Z79899 Other long term (current) drug therapy: Secondary | ICD-10-CM | POA: Diagnosis not present

## 2022-05-10 DIAGNOSIS — Z87891 Personal history of nicotine dependence: Secondary | ICD-10-CM | POA: Insufficient documentation

## 2022-05-10 DIAGNOSIS — Z955 Presence of coronary angioplasty implant and graft: Secondary | ICD-10-CM | POA: Diagnosis not present

## 2022-05-10 DIAGNOSIS — R0789 Other chest pain: Principal | ICD-10-CM | POA: Insufficient documentation

## 2022-05-10 DIAGNOSIS — R079 Chest pain, unspecified: Secondary | ICD-10-CM | POA: Diagnosis not present

## 2022-05-10 LAB — CBC
HCT: 37.9 % (ref 36.0–46.0)
Hemoglobin: 12.6 g/dL (ref 12.0–15.0)
MCH: 32.5 pg (ref 26.0–34.0)
MCHC: 33.2 g/dL (ref 30.0–36.0)
MCV: 97.7 fL (ref 80.0–100.0)
Platelets: 255 10*3/uL (ref 150–400)
RBC: 3.88 MIL/uL (ref 3.87–5.11)
RDW: 11.4 % — ABNORMAL LOW (ref 11.5–15.5)
WBC: 8 10*3/uL (ref 4.0–10.5)
nRBC: 0 % (ref 0.0–0.2)

## 2022-05-10 LAB — TROPONIN I (HIGH SENSITIVITY)
Troponin I (High Sensitivity): 16 ng/L (ref <18)
Troponin I (High Sensitivity): 16 ng/L (ref <18)

## 2022-05-10 LAB — BASIC METABOLIC PANEL
Anion gap: 9 (ref 5–15)
BUN: 10 mg/dL (ref 8–23)
CO2: 26 mmol/L (ref 22–32)
Calcium: 9.4 mg/dL (ref 8.9–10.3)
Chloride: 105 mmol/L (ref 98–111)
Creatinine, Ser: 0.91 mg/dL (ref 0.44–1.00)
GFR, Estimated: >60 mL/min (ref >60)
Glucose, Bld: 101 mg/dL — ABNORMAL HIGH (ref 70–99)
Potassium: 3.6 mmol/L (ref 3.5–5.1)
Sodium: 140 mmol/L (ref 135–145)

## 2022-05-10 LAB — APTT: aPTT: 102 seconds — ABNORMAL HIGH (ref 24–36)

## 2022-05-10 LAB — PROTIME-INR
INR: 1.1 (ref 0.8–1.2)
Prothrombin Time: 14.3 seconds (ref 11.4–15.2)

## 2022-05-10 LAB — HEPARIN LEVEL (UNFRACTIONATED): Heparin Unfractionated: 0.65 IU/mL (ref 0.30–0.70)

## 2022-05-10 MED ORDER — NITROGLYCERIN 2 % TD OINT
1.0000 [in_us] | TOPICAL_OINTMENT | Freq: Four times a day (QID) | TRANSDERMAL | Status: DC
  Administered 2022-05-10 – 2022-05-11 (×3): 1 [in_us] via TOPICAL
  Filled 2022-05-10 (×3): qty 1

## 2022-05-10 MED ORDER — HEPARIN (PORCINE) 25000 UT/250ML-% IV SOLN
850.0000 [IU]/h | INTRAVENOUS | Status: DC
  Administered 2022-05-10: 850 [IU]/h via INTRAVENOUS
  Filled 2022-05-10: qty 250

## 2022-05-10 MED ORDER — HEPARIN BOLUS VIA INFUSION
4000.0000 [IU] | Freq: Once | INTRAVENOUS | Status: AC
  Administered 2022-05-10: 4000 [IU] via INTRAVENOUS
  Filled 2022-05-10: qty 4000

## 2022-05-10 MED ORDER — ONDANSETRON HCL 4 MG/2ML IJ SOLN: 4.0000 mg | Freq: Four times a day (QID) | INTRAMUSCULAR | Status: DC | PRN

## 2022-05-10 MED ORDER — ACETAMINOPHEN 325 MG PO TABS: 650.0000 mg | ORAL_TABLET | ORAL | Status: DC | PRN

## 2022-05-10 MED ORDER — AMLODIPINE BESYLATE 5 MG PO TABS
10.0000 mg | ORAL_TABLET | Freq: Every day | ORAL | Status: DC
  Administered 2022-05-11: 10 mg via ORAL
  Filled 2022-05-10: qty 2

## 2022-05-10 MED ORDER — HYDROCHLOROTHIAZIDE 25 MG PO TABS: 25.0000 mg | ORAL_TABLET | Freq: Every day | ORAL | Status: DC

## 2022-05-10 MED ORDER — ASPIRIN 81 MG PO CHEW
81.0000 mg | CHEWABLE_TABLET | Freq: Every day | ORAL | Status: DC
  Administered 2022-05-11: 81 mg via ORAL
  Filled 2022-05-10: qty 1

## 2022-05-10 MED ORDER — ATORVASTATIN CALCIUM 20 MG PO TABS
40.0000 mg | ORAL_TABLET | Freq: Every day | ORAL | Status: DC
  Administered 2022-05-11: 40 mg via ORAL
  Filled 2022-05-10: qty 2

## 2022-05-10 MED ORDER — MELATONIN 5 MG PO TABS
10.0000 mg | ORAL_TABLET | Freq: Once | ORAL | Status: AC
  Administered 2022-05-11: 10 mg via ORAL
  Filled 2022-05-10: qty 2

## 2022-05-10 MED ORDER — IRBESARTAN 150 MG PO TABS
300.0000 mg | ORAL_TABLET | Freq: Every day | ORAL | Status: DC
  Filled 2022-05-10: qty 2

## 2022-05-10 MED ORDER — HYDROCHLOROTHIAZIDE 25 MG PO TABS
25.0000 mg | ORAL_TABLET | Freq: Every day | ORAL | Status: DC
  Filled 2022-05-10: qty 1

## 2022-05-10 MED ORDER — PANTOPRAZOLE SODIUM 40 MG PO TBEC
40.0000 mg | DELAYED_RELEASE_TABLET | Freq: Two times a day (BID) | ORAL | Status: DC
  Administered 2022-05-11: 40 mg via ORAL
  Filled 2022-05-10: qty 1

## 2022-05-10 MED ORDER — TRAZODONE HCL 50 MG PO TABS
50.0000 mg | ORAL_TABLET | Freq: Every evening | ORAL | Status: DC | PRN
  Administered 2022-05-10: 50 mg via ORAL
  Filled 2022-05-10: qty 1

## 2022-05-10 MED ORDER — OXYBUTYNIN CHLORIDE ER 10 MG PO TB24
10.0000 mg | ORAL_TABLET | Freq: Every morning | ORAL | Status: DC
  Administered 2022-05-11: 10 mg via ORAL
  Filled 2022-05-10: qty 1

## 2022-05-10 MED ORDER — CLOPIDOGREL BISULFATE 75 MG PO TABS
75.0000 mg | ORAL_TABLET | Freq: Every day | ORAL | Status: DC
  Administered 2022-05-11: 75 mg via ORAL
  Filled 2022-05-10: qty 1

## 2022-05-10 NOTE — Consult Note (Signed)
ANTICOAGULATION CONSULT NOTE  Pharmacy Consult for Heparin Infusion Indication: chest pain/ACS  Patient Measurements: Height: 5\' 5"  (165.1 cm) Weight: 74.8 kg (165 lb) IBW/kg (Calculated) : 57 Heparin Dosing Weight: 72.3 kg  Labs: Recent Labs    05/10/22 1152 05/10/22 1340  HGB 12.6  --   HCT 37.9  --   PLT 255  --   CREATININE 0.91  --   TROPONINIHS 16 16    Estimated Creatinine Clearance: 54.9 mL/min (by C-G formula based on SCr of 0.91 mg/dL).   Medical History: Past Medical History:  Diagnosis Date   Allergy    GERD (gastroesophageal reflux disease)    Hyperlipidemia    Hypertension     Medications:  No anticoagulation prior to admission per my chart review. Medication reconciliation is pending  Assessment: Patient is a 76 y/o F with medical history including CAD s/p recent cardiac stenting at [facility] who is presenting with chest pain. Pharmacy consulted to initiate and manage heparin infusion for high risk chest pain.  Baseline aPTT and PT-INR are pending. Baseline H&H and platelets are within normal limits.  Goal of Therapy:  Heparin level 0.3-0.7 units/ml Monitor platelets by anticoagulation protocol: Yes   Plan:  --Heparin 4000 unit IV bolus followed by continuous infusion at 850 units/hr --HL 8 hours from initiation of infusion --Daily CBC per protocol while on IV heparin  Benita Gutter 05/10/2022,2:28 PM

## 2022-05-10 NOTE — ED Triage Notes (Signed)
Pt to ED from [facility] for chest pain that started this morning. Pt states that she was not having the pain when she woke up but once she started moving around she started having pain. Pt states that she took 1 Nitro tablet and the pain decreased. Pt is currently in NAD. Pt does have hx/o stent placement in the past.

## 2022-05-10 NOTE — ED Notes (Signed)
Pt assisted to restroom. Pt states she is upset her night time melatonin has not been given. Pt informed that melatonin has not been ordered and will message md for order.

## 2022-05-10 NOTE — ED Provider Notes (Addendum)
[facility] Provider Note    Event Date/Time   First MD Initiated Contact with Patient 05/10/22 1213     (approximate)   History   Chest Pain   HPI  Donna Welch is a 76 y.o. female with history of recent stents few weeks ago that were placed at [facility].  Patient states she has 5 stents.  Was given a prescription for nitroglycerin to address any discomfort or chest pain.  Patient states she woke up about 930 was moving around had some chest pain took a nitroglycerin and felt a little better.  States now the chest pain feels more like pressure.  States her family was insistent that she come to the emergency department to be evaluated.  Does have history of hypertension but no diabetes.  Non-smoker but did previously smoke quit 5 years ago      Physical Exam   Triage Vital Signs: ED Triage Vitals  Enc Vitals Group     BP 05/10/22 1150 119/78     Pulse Rate 05/10/22 1150 67     Resp 05/10/22 1150 16     Temp 05/10/22 1150 99.3 F (37.4 C)     Temp Source 05/10/22 1150 Oral     SpO2 05/10/22 1150 96 %     Weight 05/10/22 1147 165 lb (74.8 kg)     Height 05/10/22 1147 5\' 5"  (1.651 m)     Head Circumference --      Peak Flow --      Pain Score 05/10/22 1146 2     Pain Loc --      Pain Edu? --      Excl. in [place]? --     Most recent vital signs: Vitals:   05/10/22 1150  BP: 119/78  Pulse: 67  Resp: 16  Temp: 99.3 F (37.4 C)  SpO2: 96%     General: Awake, no distress.   CV:  Good peripheral perfusion. regular rate and  rhythm Resp:  Normal effort. Lungs CTA Abd:  No distention.   Other:      ED Results / Procedures / Treatments   Labs (all labs ordered are listed, but only abnormal results are displayed) Labs Reviewed  BASIC METABOLIC PANEL - Abnormal; Notable for the following components:      Result Value   Glucose, Bld 101 (*)    All other components within normal limits  CBC - Abnormal; Notable for the following components:    RDW 11.4 (*)    All other components within normal limits  APTT  PROTIME-INR  TROPONIN I (HIGH SENSITIVITY)  TROPONIN I (HIGH SENSITIVITY)     EKG  EKG   RADIOLOGY Chest x-ray    PROCEDURES:   .Critical Care E&M  Performed by: Versie Starks, PA-C Critical care provider statement:    Critical care time (minutes):  45   Critical care time was exclusive of:  Separately billable procedures and treating other patients   Critical care was necessary to treat or prevent imminent or life-threatening deterioration of the following conditions:  Cardiac failure   Critical care was time spent personally by me on the following activities:  Blood draw for specimens, development of treatment plan with patient or surrogate, discussions with consultants, evaluation of patient's response to treatment, examination of patient, interpretation of cardiac output measurements, ordering and performing treatments and interventions, ordering and review of laboratory studies, ordering and review of radiographic studies, pulse oximetry, re-evaluation of patient's condition  and review of old charts   Care discussed with: admitting provider   After initial E/M assessment, critical care services were subsequently performed that were exclusive of separately billable procedures or treatment.      MEDICATIONS ORDERED IN ED: Medications  nitroGLYCERIN (NITROGLYN) 2 % ointment 1 inch (has no administration in time range)     IMPRESSION / MDM / ASSESSMENT AND PLAN / ED COURSE  I reviewed the triage vital signs and the nursing notes.                              Differential diagnosis includes, but is not limited to, MI, stable angina, unstable angina, chest wall pain  Patient's presentation is most consistent with acute presentation with potential threat to life or bodily function.   Patient placed on cardiac monitor, Chest x-ray independently reviewed and interpreted by me as being negative  Labs  are reassuring, CBC metabolic panel and first troponin are normal.  We will do a second troponin  EKG shows sinus bradycardia ventricular rate of 57, QRS 72, PR 140, QTc is 436\424, see physician.,  No STEMI noted  In review of the patient's past medical charts from [facility] her stents were placed about 5 weeks ago.  Due to the recent stent placement along with chest pain discussed with Dr. Erma Heritage.  He is in to see the patient.  He like for [place] to admit her.  Consult to cardiologist, Dr. Park Breed is on-call for [facility] clinic.  Spoke with him about the patient.  Agrees that she could be admitted here for observation, start heparin, troponins every 6 hours, Nitropaste 1 inch  Consult to hospitalist for admission Patient remained stable on cardiac monitor, no changes are noted  Spoke with dr Joylene Igo, will be admitting  On review of the past medical charts there was stent placement with complication 2 weeks ago not 5 weeks ago.  Patient is requesting to be transferred to [facility] so that she can be in the hospital with her physicians.  Spoke with transfer center.  They will be speaking with the appropriate doctors.  Vitals are normal at this time  Spoke with Dr. Chesley Mires from cardiology at [facility], he states it sounds like the patient appears to be very stable and just needs observation overnight.  However if her chest pain worsens or any symptoms worsens we are to call them immediately for consultation/transfer.  Consult hospitalist ,  Dr Huel Cote will be admitting      FINAL CLINICAL IMPRESSION(S) / ED DIAGNOSES   Final diagnoses:  Chest pain, unspecified type     Rx / DC Orders   ED Discharge Orders     None        Note:  This document was prepared using Dragon voice recognition software and may include unintentional dictation errors.    Faythe Ghee, PA-C 05/10/22 1428    Sherrie Mustache Roselyn Bering, PA-C 05/10/22 1806    Shaune Pollack, MD 05/10/22 2032

## 2022-05-10 NOTE — Assessment & Plan Note (Addendum)
Patient presents with intermittent chest pain that occurs at rest and is relieved by nitro, concerning for unstable angina.  EKG with no acute changes troponins are negative.  Patient initially requested transfer to [facility], however ED provider discussed with [facility], who recommends patient is stable enough to stay at [facility].  If there are any acute changes overnight, recommended to reach back out.  - Cardiology consulted; appreciate their recommendations.  Discussed with Dr. Humphrey Rolls who will see patient in the a.m. - Continue home aspirin and Plavix - Heparin per pharmacy dosing - Nitro as needed for chest pain - EKG as needed for repeat chest pain

## 2022-05-10 NOTE — Assessment & Plan Note (Signed)
Sinus bradycardia noted on EKG on admission.  Irregular beat noted on examination, however likely frequent PVCs.  Patient has a history of atrial fibrillation post cath, but no other episodes of A-fib.  -Telemetry monitoring

## 2022-05-10 NOTE — H&P (Signed)
History and Physical    Patient: Donna Welch OAC:[id number] DOB: 08-15-1945 DOA: 05/10/2022 DOS: the patient was seen and examined on 05/10/2022 PCP: Idelle Crouch, MD  Patient coming from: Home  Chief Complaint:  Chief Complaint  Patient presents with   Chest Pain   HPI: Donna Welch is a 76 y.o. female with medical history significant of CAD s/p recent DES to RCA complicated by RCA dissection, hypertension, hyperlipidemia, GERD, presents to the ED with chest pain.  [name] states that last night, she had an episode of substernal chest pain that she describes as a throbbing pain for which she took 2 doses of SL nitro with improvement.  She was able to go to sleep.  This morning, chest pain had recurred when she was getting ready.  She took 1 SL nitro with improvement in pain.  She denies any palpitations, shortness of breath, lower extremity swelling.  She went to the [place] clinic for evaluation and was recommended to go to the ED.  Patient was recently admitted at [facility] on 10/13 - 10/14 after an episode of chest pain with work-up remarkable for significant stenosis of the RCA and LCx on a coronary CTA.  She underwent LHC, however unfortunately, her RCA dissected following balloon inflation, requiring stent placement and a short course of pressor support.  She currently has plans to undergo staged stenting with PCI in November with [facility].  Hospitalization was also notable for post cath atrial fibrillation that resolved with amiodarone and has not recurred.  ED course: On arrival to the ED, patient was normotensive at 119/78 with heart rate of 67.  She was afebrile at 99.3.  She was saturating at 96% on room air. Initial work-up with normal electrolytes, renal function and unremarkable CBC.  Chest x-ray with no acute cardiopulmonary abnormalities.  Troponin negative x2.  Given high risk with history of recent RCA dissection, cardiology consulted; recommended initiation of  heparin.  TRH contacted for admission.  Review of Systems: As mentioned in the history of present illness. All other systems reviewed and are negative.  Past Medical History:  Diagnosis Date   Allergy    GERD (gastroesophageal reflux disease)    Hyperlipidemia    Hypertension    Past Surgical History:  Procedure Laterality Date   ABDOMINAL HYSTERECTOMY     COLONOSCOPY Bilateral    COLONOSCOPY WITH PROPOFOL N/A 12/23/2021   Procedure: COLONOSCOPY WITH PROPOFOL;  Surgeon: Lesly Rubenstein, MD;  Location: [facility] ENDOSCOPY;  Service: Endoscopy;  Laterality: N/A;   Social History:  reports that she has quit smoking. Her smoking use included cigarettes. She smoked an average of .5 packs per day. She has never used smokeless tobacco. She reports current alcohol use of about 14.0 standard drinks of alcohol per week. She reports current drug use. Drug: Marijuana.  Allergies  Allergen Reactions   Hydrocodone-Acetaminophen Other (See Comments)    Other reaction(s): Other (See Comments) High blood pressure hyperactivity    Vicodin [Hydrocodone-Acetaminophen] Other (See Comments)    hyperactivity    Family History  Problem Relation Age of Onset   Diabetes Mother    Hypertension Mother    Breast cancer Neg Hx     Prior to Admission medications   Medication Sig Start Date End Date Taking? Authorizing Provider  amLODipine (NORVASC) 5 MG tablet TAKE 1 TABLET BY MOUTH EVERY DAY 07/12/15   Ashok Norris, MD  benzonatate (TESSALON) 100 MG capsule Take 2 capsules (200 mg total) by mouth every  8 (eight) hours. 08/25/21   Becky Augusta, NP  fexofenadine-pseudoephedrine (ALLEGRA-D) 60-120 MG per tablet Take 1 tablet by mouth 2 (two) times daily. 01/08/15   Dennison Mascot, MD  fluticasone (FLONASE) 50 MCG/ACT nasal spray USE ONE SPRAY IN EACH NOSTRIL TWICE DAILY. 03/14/15   Dennison Mascot, MD  ipratropium (ATROVENT) 0.06 % nasal spray Place 2 sprays into both nostrils 4 (four) times daily.  08/25/21   Becky Augusta, NP  losartan-hydrochlorothiazide (HYZAAR) 100-25 MG per tablet Take 1 tablet by mouth daily. 01/08/15   Dennison Mascot, MD  Multiple Vitamin (DAILY VITAMINS) tablet Take by mouth. 12/06/02   [provider]  omeprazole (PRILOSEC) 40 MG capsule TAKE 1 CAPSULE BY MOUTH EVERY DAY 07/31/15   Dennison Mascot, MD  oxybutynin (DITROPAN-XL) 10 MG 24 hr tablet Take 1 tablet (10 mg total) by mouth daily. 01/08/15   Dennison Mascot, MD  oxybutynin (DITROPAN-XL) 10 MG 24 hr tablet Take 1 tablet (10 mg total) by mouth daily. 01/08/15   Dennison Mascot, MD  pantoprazole (PROTONIX) 40 MG tablet Take by mouth. 03/30/19 03/29/20  [provider]  pravastatin (PRAVACHOL) 20 MG tablet Take by mouth. 03/07/19 03/06/20  [provider]  promethazine-dextromethorphan (PROMETHAZINE-DM) 6.25-15 MG/5ML syrup Take 5 mLs by mouth 4 (four) times daily as needed. 08/25/21   Becky Augusta, NP  telmisartan-hydrochlorothiazide (MICARDIS HCT) 80-25 MG tablet Take by mouth. 11/15/18 11/15/19  [provider]  traZODone (DESYREL) 50 MG tablet TAKE 1 TABLET BY MOUTH NIGHTLY 05/01/19   [provider]   Physical Exam: Vitals:   05/10/22 1530 05/10/22 1701 05/10/22 2014 05/10/22 2015  BP: (!) 142/90 (!) 105/91  119/73  Pulse: 65 75  (!) 56  Resp: 16 18  15   Temp:   99.5 F (37.5 C)   TempSrc:   Oral   SpO2: 95% 97%  97%  Weight:      Height:       Physical Exam Vitals and nursing note reviewed.  Constitutional:      General: She is not in acute distress.    Appearance: She is normal weight. She is not toxic-appearing.  HENT:     Head: Normocephalic and atraumatic.  Eyes:     Extraocular Movements: Extraocular movements intact.     Pupils: Pupils are equal, round, and reactive to light.  Neck:     Vascular: No JVD.  Cardiovascular:     Rate and Rhythm: Normal rate. Rhythm irregular.     Pulses:          Radial pulses are 2+ on the right side and 2+ on the  left side.     Heart sounds: Normal heart sounds. Heart sounds not distant.     No systolic murmur is present.     No diastolic murmur is present.  Pulmonary:     Effort: Pulmonary effort is normal. No tachypnea or respiratory distress.     Breath sounds: Normal breath sounds. No decreased breath sounds, wheezing, rhonchi or rales.  Chest:     Chest wall: No tenderness or edema.  Abdominal:     General: Bowel sounds are normal.     Palpations: Abdomen is soft.  Musculoskeletal:     Cervical back: Neck supple.     Right lower leg: No tenderness. No edema.     Left lower leg: No tenderness. No edema.  Skin:    General: Skin is warm and dry.  Neurological:     General: No focal deficit present.  Mental Status: She is alert and oriented to person, place, and time.  Psychiatric:        Mood and Affect: Mood normal.        Behavior: Behavior normal.    Data Reviewed: CBC notable for lack of leukocytosis, anemia or thrombocytopenia.  BMP with glucose of 101, but no other electrolyte or renal dysfunction noted.  Troponins within normal limits and flat.  EKG personally reviewed.  Sinus rhythm with a rate of 57.  No ST or T wave changes concerning for acute ischemia.  DG Chest 2 View  Result Date: 05/10/2022 CLINICAL DATA:  Chest pain EXAM: CHEST - 2 VIEW COMPARISON:  03/24/2022 FINDINGS: Heart size and mediastinal contours are unremarkable. No pleural effusion or edema identified. No airspace opacities. Review of the visualized osseous structures is unremarkable. IMPRESSION: No active cardiopulmonary abnormalities. Electronically Signed   By: Signa Kell M.D.   On: 05/10/2022 12:00     There are no new results to review at this time.  Assessment and Plan: * Chest pain Patient presents with intermittent chest pain that occurs at rest and is relieved by nitro, concerning for unstable angina.  EKG with no acute changes troponins are negative.  Patient initially requested transfer  to [facility], however ED provider discussed with [facility], who recommends patient is stable enough to stay at [facility].  If there are any acute changes overnight, recommended to reach back out.  - Cardiology consulted; appreciate their recommendations.  Discussed with Dr. Welton Flakes who will see patient in the a.m. - Continue home aspirin and Plavix - Heparin per pharmacy dosing - Nitro as needed for chest pain - EKG as needed for repeat chest pain  HTN, goal below 140/80 - Restart home antihypertensives  Atherosclerosis of native coronary artery of native heart with stable angina pectoris [facility]) Patient underwent recent RCA stenting with plans for PCI November to evaluate need for LCx intervention.  Followed by [facility] cardiology.  - Please see above for management of chest pain - Restart home aspirin, Plavix, statin  Bradycardia Sinus bradycardia noted on EKG on admission.  Irregular beat noted on examination, however likely frequent PVCs.  Patient has a history of atrial fibrillation post cath, but no other episodes of A-fib.  -Telemetry monitoring   Advance Care Planning:   Code Status: Full Code   Consults: Cardiology  Family Communication: No family at bedside  Severity of Illness: The appropriate patient status for this patient is OBSERVATION. Observation status is judged to be reasonable and necessary in order to provide the required intensity of service to ensure the patient's safety. The patient's presenting symptoms, physical exam findings, and initial radiographic and laboratory data in the context of their medical condition is felt to place them at decreased risk for further clinical deterioration. Furthermore, it is anticipated that the patient will be medically stable for discharge from the hospital within 2 midnights of admission.   Author: Verdene Lennert, MD 05/10/2022 8:32 PM  For on call review [email or web address].

## 2022-05-10 NOTE — Assessment & Plan Note (Signed)
-   Restart home antihypertensives 

## 2022-05-10 NOTE — Progress Notes (Signed)
Called to admit patient with a known history of coronary artery disease status post recent PCI with stent angioplasty with complications of dissection of coronary artery during the procedure for chest pain evaluation. Patient was seen in the ER and refuses to be admitted here at [facility].  Wants to be transferred to [facility] where her cardiologist is. ER physician notified

## 2022-05-10 NOTE — Assessment & Plan Note (Signed)
Patient underwent recent RCA stenting with plans for PCI November to evaluate need for LCx intervention.  Followed by [facility] cardiology.  - Please see above for management of chest pain - Restart home aspirin, Plavix, statin

## 2022-05-10 NOTE — Consult Note (Addendum)
ANTICOAGULATION CONSULT NOTE  Pharmacy Consult for Heparin Infusion Indication: chest pain/ACS  Patient Measurements: Height: 5\' 5"  (165.1 cm) Weight: 74.8 kg (165 lb) IBW/kg (Calculated) : 57 Heparin Dosing Weight: 72.3 kg  Labs: Recent Labs    05/10/22 1152 05/10/22 1340 05/10/22 2006 05/10/22 2220  HGB 12.6  --   --   --   HCT 37.9  --   --   --   PLT 255  --   --   --   APTT  --   --  102*  --   LABPROT  --   --  14.3  --   INR  --   --  1.1  --   HEPARINUNFRC  --   --   --  0.65  CREATININE 0.91  --   --   --   TROPONINIHS 16 16  --   --      Estimated Creatinine Clearance: 54.9 mL/min (by C-G formula based on SCr of 0.91 mg/dL).   Medical History: Past Medical History:  Diagnosis Date   Allergy    GERD (gastroesophageal reflux disease)    Hyperlipidemia    Hypertension     Medications:  No anticoagulation prior to admission per my chart review. Medication reconciliation is pending  Assessment: Patient is a 76 y/o F with medical history including CAD s/p recent cardiac stenting at [facility] who is presenting with chest pain. Pharmacy consulted to initiate and manage heparin infusion for high risk chest pain.  Baseline aPTT and PT-INR are pending. Baseline H&H and platelets are within normal limits.  Date/Time HL Rate/Comment 10/28 22:20  0.65 Therapeutic x 1 ; 850 un/hr   Goal of Therapy:  Heparin level 0.3-0.7 units/ml Monitor platelets by anticoagulation protocol: Yes   Plan: Heparin level is therapeutic x 1 --Continue heparin infusion at 850 units/hr --Confirmatory HL 8 hours from previous level --Daily CBC per protocol while on IV heparin.  --Follow up cardiology plans 10/29 AM.   Wynelle Cleveland 05/10/2022,10:50 PM

## 2022-05-10 NOTE — Consult Note (Signed)
Patient presented with chest pain, and extensive h/o CAD. Although 2 sets troponins negative, EKG NSR  low voltage, non-specific st changes, advise admission with IV heparin/1 inch NTP q6hours

## 2022-05-10 NOTE — ED Notes (Signed)
Called [facility] spoke with Rosann Auerbach in reference to possible Cardiac transfer

## 2022-05-11 DIAGNOSIS — R079 Chest pain, unspecified: Secondary | ICD-10-CM | POA: Diagnosis not present

## 2022-05-11 LAB — CBC
HCT: 33.6 % — ABNORMAL LOW (ref 36.0–46.0)
Hemoglobin: 11.1 g/dL — ABNORMAL LOW (ref 12.0–15.0)
MCH: 32.2 pg (ref 26.0–34.0)
MCHC: 33 g/dL (ref 30.0–36.0)
MCV: 97.4 fL (ref 80.0–100.0)
Platelets: 246 10*3/uL (ref 150–400)
RBC: 3.45 MIL/uL — ABNORMAL LOW (ref 3.87–5.11)
RDW: 11.4 % — ABNORMAL LOW (ref 11.5–15.5)
WBC: 6.5 10*3/uL (ref 4.0–10.5)
nRBC: 0 % (ref 0.0–0.2)

## 2022-05-11 LAB — BASIC METABOLIC PANEL
Anion gap: 8 (ref 5–15)
BUN: 14 mg/dL (ref 8–23)
CO2: 27 mmol/L (ref 22–32)
Calcium: 9 mg/dL (ref 8.9–10.3)
Chloride: 103 mmol/L (ref 98–111)
Creatinine, Ser: 0.99 mg/dL (ref 0.44–1.00)
GFR, Estimated: 60 mL/min — ABNORMAL LOW (ref >60)
Glucose, Bld: 107 mg/dL — ABNORMAL HIGH (ref 70–99)
Potassium: 3.3 mmol/L — ABNORMAL LOW (ref 3.5–5.1)
Sodium: 138 mmol/L (ref 135–145)

## 2022-05-11 LAB — HEPARIN LEVEL (UNFRACTIONATED): Heparin Unfractionated: 0.66 IU/mL (ref 0.30–0.70)

## 2022-05-11 MED ORDER — ISOSORBIDE MONONITRATE 20 MG PO TABS: 10.0000 mg | ORAL_TABLET | Freq: Two times a day (BID) | ORAL | Status: DC

## 2022-05-11 MED ORDER — POTASSIUM CHLORIDE 20 MEQ PO PACK
40.0000 meq | PACK | Freq: Once | ORAL | Status: AC
  Administered 2022-05-11: 40 meq via ORAL
  Filled 2022-05-11: qty 2

## 2022-05-11 NOTE — Consult Note (Signed)
ANTICOAGULATION CONSULT NOTE  Pharmacy Consult for Heparin Infusion Indication: chest pain/ACS  Patient Measurements: Height: 5\' 5"  (165.1 cm) Weight: 74.8 kg (165 lb) IBW/kg (Calculated) : 57 Heparin Dosing Weight: 72.3 kg  Labs: Recent Labs    05/10/22 1152 05/10/22 1340 05/10/22 2006 05/10/22 2220 05/11/22 0539  HGB 12.6  --   --   --  11.1*  HCT 37.9  --   --   --  33.6*  PLT 255  --   --   --  246  APTT  --   --  102*  --   --   LABPROT  --   --  14.3  --   --   INR  --   --  1.1  --   --   HEPARINUNFRC  --   --   --  0.65 0.66  CREATININE 0.91  --   --   --   --   TROPONINIHS 16 16  --   --   --      Estimated Creatinine Clearance: 54.9 mL/min (by C-G formula based on SCr of 0.91 mg/dL).   Medical History: Past Medical History:  Diagnosis Date   Allergy    GERD (gastroesophageal reflux disease)    Hyperlipidemia    Hypertension     Medications:  No anticoagulation prior to admission per my chart review. Medication reconciliation is pending  Assessment: Patient is a 76 y/o F with medical history including CAD s/p recent cardiac stenting at [facility] who is presenting with chest pain. Pharmacy consulted to initiate and manage heparin infusion for high risk chest pain.  Baseline aPTT and PT-INR are pending. Baseline H&H and platelets are within normal limits.  Date/Time HL Rate/Comment 10/28 22:20  0.65 Therapeutic x 1 ; 850 un/hr 10/28 0616 0.66 Therapeutic x 2  Goal of Therapy:  Heparin level 0.3-0.7 units/ml Monitor platelets by anticoagulation protocol: Yes   Plan: Heparin level is therapeutic x 2 --Continue heparin infusion at 850 units/hr --Will transition to daily HL --Daily CBC per protocol while on IV heparin.  --Follow up cardiology plans 10/29 AM.   Wynelle Cleveland 05/11/2022,6:18 AM

## 2022-05-11 NOTE — ED Notes (Signed)
Pt ambulated to restroom. 

## 2022-05-11 NOTE — ED Notes (Signed)
Advised nurse that patient has ready bed 

## 2022-05-11 NOTE — ED Notes (Signed)
Patient is resting in bed. Call bell is within reach. 

## 2022-05-11 NOTE — Consult Note (Signed)
Donna Welch is a 76 y.o. female  [id number]  Primary Cardiologist: Dr. Juliann Pares Reason for Consultation: Chest pain  HPI: 76 year old white female who presented to the hospital with chest pain associated with shortness of breath and diaphoresis.  Patient apparently had PCI at [facility] few weeks back and is scheduled for another PCI on November 17 at [facility] cardiology.  Patient presented with chest pain associated with shortness of breath but had no further chest pain on nitrates and heparin since last night.   Review of Systems: No orthopnea PND or leg swelling   Past Medical History:  Diagnosis Date   Allergy    GERD (gastroesophageal reflux disease)    Hyperlipidemia    Hypertension     (Not in a hospital admission)     amLODipine  10 mg Oral Daily   aspirin  81 mg Oral Daily   atorvastatin  40 mg Oral Daily   clopidogrel  75 mg Oral Daily   irbesartan  300 mg Oral Daily   And   hydrochlorothiazide  25 mg Oral Daily   nitroGLYCERIN  1 inch Topical Q6H   oxybutynin  10 mg Oral q morning   pantoprazole  40 mg Oral BID AC    Infusions:  heparin Stopped (05/11/22 1120)    Allergies  Allergen Reactions   Hydrocodone-Acetaminophen Other (See Comments)    Other reaction(s): Other (See Comments) High blood pressure hyperactivity    Vicodin [Hydrocodone-Acetaminophen] Other (See Comments)    hyperactivity    Social History   Socioeconomic History   Marital status: Widowed    Spouse name: Not on file   Number of children: Not on file   Years of education: Not on file   Highest education level: Not on file  Occupational History   Not on file  Tobacco Use   Smoking status: Former    Packs/day: 0.50    Types: Cigarettes   Smokeless tobacco: Never   Tobacco comments:    quit for a year then started back after mother died.  Vaping Use   Vaping Use: Never used  Substance and Sexual Activity   Alcohol use: Yes    Alcohol/week: 14.0 standard  drinks of alcohol    Types: 14 Standard drinks or equivalent per week   Drug use: Yes    Types: Marijuana   Sexual activity: Yes    Partners: Male  Other Topics Concern   Not on file  Social History Narrative   Not on file   Social Determinants of Health   Financial Resource Strain: Not on file  Food Insecurity: Not on file  Transportation Needs: Not on file  Physical Activity: Not on file  Stress: Not on file  Social Connections: Not on file  Intimate Partner Violence: Not on file    Family History  Problem Relation Age of Onset   Diabetes Mother    Hypertension Mother    Breast cancer Neg Hx     PHYSICAL EXAM: Vitals:   05/11/22 1004 05/11/22 1018  BP: (!) 117/92 127/61  Pulse:  66  Resp:  19  Temp:  98.3 F (36.8 C)  SpO2:  99%     Intake/Output Summary (Last 24 hours) at 05/11/2022 1218 Last data filed at 05/11/2022 1120 Gross per 24 hour  Intake 205.63 ml  Output --  Net 205.63 ml    General:  Well appearing. No respiratory difficulty HEENT: normal Neck: supple. no JVD. Carotids 2+ bilat; no  bruits. No lymphadenopathy or thryomegaly appreciated. Cor: PMI nondisplaced. Regular rate & rhythm. No rubs, gallops or murmurs. Lungs: clear Abdomen: soft, nontender, nondistended. No hepatosplenomegaly. No bruits or masses. Good bowel sounds. Extremities: no cyanosis, clubbing, rash, edema Neuro: alert & oriented x 3, cranial nerves grossly intact. moves all 4 extremities w/o difficulty. Affect pleasant.  ECG: Normal sinus rhythm with sinus bradycardia and low voltage nonspecific ST-T changes  Results for orders placed or performed during the hospital encounter of 05/10/22 (from the past 24 hour(s))  Troponin I (High Sensitivity)     Status: None   Collection Time: 05/10/22  1:40 PM  Result Value Ref Range   Troponin I (High Sensitivity) 16 <18 ng/L  APTT     Status: Abnormal   Collection Time: 05/10/22  8:06 PM  Result Value Ref Range   aPTT 102 (H) 24  - 36 seconds  Protime-INR     Status: None   Collection Time: 05/10/22  8:06 PM  Result Value Ref Range   Prothrombin Time 14.3 11.4 - 15.2 seconds   INR 1.1 0.8 - 1.2  Heparin level (unfractionated)     Status: None   Collection Time: 05/10/22 10:20 PM  Result Value Ref Range   Heparin Unfractionated 0.65 0.30 - 0.70 IU/mL  CBC     Status: Abnormal   Collection Time: 05/11/22  5:39 AM  Result Value Ref Range   WBC 6.5 4.0 - 10.5 K/uL   RBC 3.45 (L) 3.87 - 5.11 MIL/uL   Hemoglobin 11.1 (L) 12.0 - 15.0 g/dL   HCT 33.6 (L) 36.0 - 46.0 %   MCV 97.4 80.0 - 100.0 fL   MCH 32.2 26.0 - 34.0 pg   MCHC 33.0 30.0 - 36.0 g/dL   RDW 11.4 (L) 11.5 - 15.5 %   Platelets 246 150 - 400 K/uL   nRBC 0.0 0.0 - 0.2 %  Basic metabolic panel     Status: Abnormal   Collection Time: 05/11/22  5:39 AM  Result Value Ref Range   Sodium 138 135 - 145 mmol/L   Potassium 3.3 (L) 3.5 - 5.1 mmol/L   Chloride 103 98 - 111 mmol/L   CO2 27 22 - 32 mmol/L   Glucose, Bld 107 (H) 70 - 99 mg/dL   BUN 14 8 - 23 mg/dL   Creatinine, Ser 0.99 0.44 - 1.00 mg/dL   Calcium 9.0 8.9 - 10.3 mg/dL   GFR, Estimated 60 (L) >60 mL/min   Anion gap 8 5 - 15  Heparin level (unfractionated)     Status: None   Collection Time: 05/11/22  5:39 AM  Result Value Ref Range   Heparin Unfractionated 0.66 0.30 - 0.70 IU/mL   DG Chest 2 View  Result Date: 05/10/2022 CLINICAL DATA:  Chest pain EXAM: CHEST - 2 VIEW COMPARISON:  03/24/2022 FINDINGS: Heart size and mediastinal contours are unremarkable. No pleural effusion or edema identified. No airspace opacities. Review of the visualized osseous structures is unremarkable. IMPRESSION: No active cardiopulmonary abnormalities. Electronically Signed   By: Kerby Moors M.D.   On: 05/10/2022 12:00     ASSESSMENT AND PLAN: Multivessel coronary artery disease with angina.  Ruled out for myocardial infarction.  Status post PCI at [facility] cardiology and scheduled for another vessel PCI on November  17.  Advise maximizing medical therapy and discharged her with follow-up to cardiology.  Patient does not want to have cardiac cath or any procedure here.  Donna Welch A

## 2022-05-11 NOTE — Discharge Summary (Signed)
Physician Discharge Summary  Donna Welch UUV:[id number] DOB: 09-27-1945 DOA: 05/10/2022  PCP: Idelle Crouch, MD  Admit date: 05/10/2022  Discharge date: 05/11/2022  Admitted From: Home.  Disposition:Home.  Recommendations for Outpatient Follow-up:  Follow up with PCP in 1-2 weeks Please obtain BMP/CBC in one week. Advised to follow-up with cardiology @ [facility].  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  Patient reported having an episode of substernal chest pain that she describes as throbbing pain which resolves after taking 2 doses of sublingual nitro.  She went to sleep and then woke up in the morning having similar pain.  She has taken 1 more sublingual nitro and and pain resolved.  She went to see [facility] clinic and was told to come in the ED. Patient was recently admitted at [facility] on 10/13 - 10/14 after an episode of chest pain with work-up remarkable for significant stenosis of the RCA and LCx on a coronary CTA.  She underwent LHC, however unfortunately, her RCA dissected following balloon inflation, requiring stent placement and a short course of pressor support.  She currently has plans to undergo staged stenting with PCI in November with [facility].  Hospitalization was also notable for post cath atrial fibrillation that resolved with amiodarone and has not recurred. Patient was admitted for intermittent chest pain consistent with unstable angina.  She described chest pain occurs at rest and is relieved by nitro.  EKG : showed no acute changes,  troponins negative.  Patient initially requested transfer to [facility] however ED physician has spoken with [facility] who recommended  patient is stable enough to stay at [facility].  Cardiology was consulted.  Patient was continued on heparin drip.  Patient reports chest pain has resolved.  She is a scheduled for another vessel PCI on November 17 at [facility].Cardiology recommended maximize medical therapy and Patient was discharged. Patient does not want to have left heart cath or any procedure here at [facility].   Dr.Shaukat Humphrey Rolls states that he has told RN to notify attending that cardiology signed off and patient can be discharged but patient was discharged before attending MD does the discharge completion.  Chest pain has resolved.  Discharge Diagnoses:  Principal Problem:   Chest pain Active Problems:   HTN, goal below 140/80   Atherosclerosis of native coronary artery of native heart with stable angina pectoris ([place])   Bradycardia  Discharge Instructions Advised to follow-up with a cardiologist at [facility]. Advised to continue current medications.   Allergies  Allergen Reactions   Hydrocodone-Acetaminophen Other (See Comments)    Other reaction(s): Other (See Comments) High blood pressure hyperactivity    Vicodin [Hydrocodone-Acetaminophen] Other (See Comments)    hyperactivity    Consultations: Cardiology   Procedures/Studies: DG Chest 2 View  Result Date: 05/10/2022 CLINICAL DATA:  Chest pain EXAM: CHEST - 2 VIEW COMPARISON:  03/24/2022 FINDINGS: Heart size and mediastinal contours are unremarkable. No pleural effusion or edema identified. No airspace opacities. Review of the visualized osseous structures is unremarkable. IMPRESSION: No active cardiopulmonary abnormalities. Electronically Signed   By: Kerby Moors M.D.   On: 05/10/2022 12:00     Subjective: Patient was seen and examined at bedside, no overnight events. Patient reports chest pain has resolved.  Patient was evaluated by cardiology and patient was discharged.  Discharge Exam: Vitals:   05/11/22 1004 05/11/22 1018  BP: (!) 117/92 127/61  Pulse:  66   Resp:  19  Temp:  98.3 F (36.8 C)  SpO2:  99%   Vitals:   05/11/22 0400 05/11/22 0543 05/11/22 1004 05/11/22 1018  BP: (!) 101/53 94/74 (!) 117/92 127/61  Pulse: (!) 55 (!) 55  66  Resp: 17 14  19   Temp: 98.6 F (37 C) 98.4 F (36.9 C)  98.3 F (36.8 C)  TempSrc: Oral Oral  Oral  SpO2: 93% 95%  99%  Weight:      Height:        General: Pt is alert, awake, not in acute distress Cardiovascular: RRR, S1/S2 +, no rubs, no gallops Respiratory: CTA bilaterally, no wheezing, no rhonchi Abdominal: Soft, NT, ND, bowel sounds + Extremities: no edema, no cyanosis    The results of significant diagnostics from this hospitalization (including imaging, microbiology, ancillary and laboratory) are listed below for reference.     Microbiology: No results found for this or any previous visit (from the past 240 hour(s)).   Labs: BNP (last 3 results) No results for input(s): "BNP" in the last 8760 hours. Basic Metabolic Panel: Recent Labs  Lab 05/10/22 1152 05/11/22 0539  NA 140 138  K 3.6 3.3*  CL 105 103  CO2 26 27  GLUCOSE 101* 107*  BUN 10 14  CREATININE 0.91 0.99  CALCIUM 9.4 9.0   Liver Function Tests: No results for input(s): "AST", "ALT", "ALKPHOS", "BILITOT", "PROT", "ALBUMIN" in the last 168 hours. No results for input(s): "LIPASE", "AMYLASE" in the last 168 hours. No results for input(s): "AMMONIA" in the last 168 hours. CBC: Recent Labs  Lab 05/10/22 1152 05/11/22 0539  WBC 8.0 6.5  HGB 12.6 11.1*  HCT 37.9 33.6*  MCV 97.7 97.4  PLT 255 246   Cardiac Enzymes: No results for input(s): "CKTOTAL", "CKMB", "CKMBINDEX", "TROPONINI" in the last 168 hours. BNP: Invalid input(s): "POCBNP" CBG: No results for input(s): "GLUCAP" in the last 168 hours. D-Dimer No results for input(s): "DDIMER" in the last 72 hours. Hgb A1c No results for input(s): "HGBA1C" in the last 72 hours. Lipid Profile No results for input(s): "CHOL", "HDL", "LDLCALC", "TRIG",  "CHOLHDL", "LDLDIRECT" in the last 72 hours. Thyroid function studies No results for input(s): "TSH", "T4TOTAL", "T3FREE", "THYROIDAB" in the last 72 hours.  Invalid input(s): "FREET3" Anemia work up No results for input(s): "VITAMINB12", "FOLATE", "FERRITIN", "TIBC", "IRON", "RETICCTPCT" in the last 72 hours. Urinalysis No results found for: "COLORURINE", "APPEARANCEUR", "LABSPEC", "PHURINE", "GLUCOSEU", "HGBUR", "BILIRUBINUR", "KETONESUR", "PROTEINUR", "UROBILINOGEN", "NITRITE", "LEUKOCYTESUR" Sepsis Labs Recent Labs  Lab 05/10/22 1152 05/11/22 0539  WBC 8.0 6.5   Microbiology No results found for this or any previous visit (from the past 240 hour(s)).   Time coordinating discharge: Over 30 minutes  SIGNED:   Shawna Clamp, MD  [facility] 05/11/2022, 2:16 PM Pager   If 7PM-7AM, please contact night-coverage

## 2022-05-30 DIAGNOSIS — Z955 Presence of coronary angioplasty implant and graft: Secondary | ICD-10-CM | POA: Insufficient documentation

## 2022-07-16 ENCOUNTER — Ambulatory Visit: Payer: Medicare Other | Admitting: Podiatry

## 2022-07-16 ENCOUNTER — Ambulatory Visit (INDEPENDENT_AMBULATORY_CARE_PROVIDER_SITE_OTHER): Payer: Medicare Other

## 2022-07-16 DIAGNOSIS — Z87891 Personal history of nicotine dependence: Secondary | ICD-10-CM

## 2022-07-16 DIAGNOSIS — M2041 Other hammer toe(s) (acquired), right foot: Secondary | ICD-10-CM

## 2022-07-16 DIAGNOSIS — R0989 Other specified symptoms and signs involving the circulatory and respiratory systems: Secondary | ICD-10-CM

## 2022-07-16 DIAGNOSIS — R739 Hyperglycemia, unspecified: Secondary | ICD-10-CM | POA: Insufficient documentation

## 2022-07-16 NOTE — Patient Instructions (Addendum)
Call [facility] to schedule your ultrasound at the below locations.  Please allow at least 1 business day after your visit to process the referral.  It may take longer depending on approval from insurance.  Please let me know if you have issues or problems scheduling the ultrasound   [facility] [place] [phone] [address] , [place] [address]  [facility] [place] [phone] [address] [place], [place] [address]  

## 2022-07-17 ENCOUNTER — Encounter: Payer: Self-pay | Admitting: Podiatry

## 2022-07-17 ENCOUNTER — Telehealth: Payer: Self-pay | Admitting: *Deleted

## 2022-07-17 NOTE — Telephone Encounter (Signed)
Called patient to update that she has to go to [place] location to have [place] since the radiologist only works out of that office. There was no answer, left voice message.

## 2022-07-17 NOTE — Telephone Encounter (Signed)
[facility] is calling to let the physician know that they called the patient to schedule for [place] but the patient only wants to go to the [place] location but the radiologist that performs the study only works out of the [place] advise.

## 2022-07-17 NOTE — Progress Notes (Signed)
  Subjective:  Patient ID: Donna Welch, female    DOB: May 19, 1946,  MRN: [id number]  Chief Complaint  Patient presents with   Toe Pain    (np) R 2nd toe deformity    77 y.o. female presents with the above complaint. History confirmed with patient.  She started to have pain in the second toe were crossed over and rubs against the big toe as well as under the joint.  Objective:  Physical Exam: warm, good capillary refill, no trophic changes or ulcerative lesions, strong DP and weakly palpable PT pulses, normal sensory exam, and on the right foot she has a semireducible hammertoe deformity with pain around the MTPJ and plantar.   Radiographs: Multiple views x-ray of the right foot:  Lesser hammertoe deformities with flexion contracture of the second toe Assessment:   1. Hammertoe of second toe of right foot   2. Former smoker   3. Weak pulse      Plan:  Patient was evaluated and treated and all questions answered.   We reviewed her x-rays and discussed surgical nonsurgical treatment of her deformity.  We discussed surgical treatment which I think she may benefit from.  She does have a history of recent cardiac PCI and I recommended any significant surgery requiring anesthetic be delayed until 6 months after this.  We can go ahead and evaluate the joint with an ultrasound which I ordered and she will schedule.  This will be to evaluate any disruption of the plantar plate for surgical planning.  May also require IPJ fusion and Weil osteotomy.  We discussed some of this may be able to be corrected with percutaneous minimally invasive approaches with tenotomy capsulotomy under local anesthetic.  I will see her back after she has completed her ultrasound as well as an ABI which I ordered to evaluate her arterial circulation she has a strong DP and weakly palpable PT pulse.  She is a former smoker and has risk factors for arterial disease  Return for after ultrasound to review.

## 2022-07-24 ENCOUNTER — Ambulatory Visit (HOSPITAL_COMMUNITY)
Admission: RE | Admit: 2022-07-24 | Discharge: 2022-07-24 | Disposition: A | Discharge status: Home / Self Care | Payer: Medicare Other | Source: Ambulatory Visit | Attending: Podiatry | Admitting: Podiatry

## 2022-07-24 DIAGNOSIS — Z87891 Personal history of nicotine dependence: Secondary | ICD-10-CM

## 2022-07-24 DIAGNOSIS — R0989 Other specified symptoms and signs involving the circulatory and respiratory systems: Secondary | ICD-10-CM

## 2022-07-24 LAB — VAS US PAD ABI
Left ABI: 0.81
Right ABI: 0.53

## 2022-07-29 ENCOUNTER — Other Ambulatory Visit: Payer: Self-pay

## 2022-07-29 DIAGNOSIS — M2041 Other hammer toe(s) (acquired), right foot: Secondary | ICD-10-CM

## 2022-07-30 ENCOUNTER — Telehealth: Payer: Self-pay | Admitting: Podiatry

## 2022-07-30 NOTE — Telephone Encounter (Signed)
That is fine if she wants to hold off on surgery but I still recommend she keep her appointment to be evaluated in case this gets worse

## 2022-07-30 NOTE — Telephone Encounter (Signed)
Spoke to the patient and she verbalized understanding as to why she was referred to vein and vascular for further evaluation. Patient did states that she is not considering foot surgery unless she truly needs it but right now she is fine with not having foot surgery. Patient has an appt with [facility] tomorrow.

## 2022-07-30 NOTE — Telephone Encounter (Signed)
Pt called and states that she had an ultra sound done on her veins last Thursday. She says she was told to come back to the vascular center to get the results. Pt is concerned as to why she need to go back to that facility for results. She thinks that we should get the results and then Dr Sherryle Lis will give her the results.  Please advise

## 2022-07-31 ENCOUNTER — Encounter: Payer: Medicare Other | Admitting: Vascular Surgery

## 2022-08-11 ENCOUNTER — Encounter: Payer: Self-pay | Admitting: Surgery

## 2022-08-11 ENCOUNTER — Ambulatory Visit: Payer: Medicare Other | Admitting: Surgery

## 2022-08-11 VITALS — BP 116/77 | HR 60 | Temp 98.3°F | Resp 20 | Ht 65.0 in | Wt 167.0 lb

## 2022-08-11 DIAGNOSIS — I739 Peripheral vascular disease, unspecified: Secondary | ICD-10-CM

## 2022-08-11 NOTE — Progress Notes (Signed)
[facility]ist of [place]  Patient name: Donna Welch MRN: [id number] DOB: [birth year] Sex: female   REQUESTING PROVIDER:    Dr. Sherryle Lis   REASON FOR CONSULT:    PAD  HISTORY OF PRESENT ILLNESS:   Donna Welch is a 77 y.o. female, who is referred for evaluation of blood flow to her right foot in anticipation of having surgery for her hammertoe.  She is delaying her surgery on her foot for 6 months because she had coronary stenting in November for unstable angina.  She is unclear about her antiplatelet medication at this time.  She is on a statin for hypercholesterolemia.  She is medically managed for hypertension.  She is a former smoker.  The patient denies symptoms of claudication.  She does not have any open wounds.  PAST MEDICAL HISTORY    Past Medical History:  Diagnosis Date   Allergy    GERD (gastroesophageal reflux disease)    Hyperlipidemia    Hypertension      FAMILY HISTORY   Family History  Problem Relation Age of Onset   Diabetes Mother    Hypertension Mother    Breast cancer Neg Hx     SOCIAL HISTORY:   Social History   Socioeconomic History   Marital status: Widowed    Spouse name: Not on file   Number of children: Not on file   Years of education: Not on file   Highest education level: Not on file  Occupational History   Not on file  Tobacco Use   Smoking status: Former    Packs/day: 0.50    Types: Cigarettes   Smokeless tobacco: Never   Tobacco comments:    quit for a year then started back after mother died.  Vaping Use   Vaping Use: Never used  Substance and Sexual Activity   Alcohol use: Yes    Alcohol/week: 14.0 standard drinks of alcohol    Types: 14 Standard drinks or equivalent per week   Drug use: Yes    Types: Marijuana   Sexual activity: Yes    Partners: Male  Other Topics Concern   Not on file  Social History Narrative   Not on file   Social Determinants of  Health   Financial Resource Strain: Not on file  Food Insecurity: Not on file  Transportation Needs: Not on file  Physical Activity: Not on file  Stress: Not on file  Social Connections: Not on file  Intimate Partner Violence: Not on file    ALLERGIES:    Allergies  Allergen Reactions   Hydrocodone-Acetaminophen Other (See Comments)    Other reaction(s): Other (See Comments) High blood pressure hyperactivity    Vicodin [Hydrocodone-Acetaminophen] Other (See Comments)    hyperactivity    CURRENT MEDICATIONS:    Current Outpatient Medications  Medication Sig Dispense Refill   amLODipine (NORVASC) 10 MG tablet Take 10 mg by mouth daily.     atorvastatin (LIPITOR) 40 MG tablet Take 40 mg by mouth daily.     clopidogrel (PLAVIX) 75 MG tablet Take 75 mg by mouth daily.     fexofenadine-pseudoephedrine (ALLEGRA-D) 60-120 MG per tablet Take 1 tablet by mouth 2 (two) times daily. (Patient taking differently: Take 1 tablet by mouth 2 (two) times daily as needed.) 30 tablet 5   fluticasone (FLONASE) 50 MCG/ACT nasal spray USE ONE SPRAY IN EACH NOSTRIL TWICE DAILY. (Patient taking differently: 2 sprays daily as needed for allergies.) 1 g 11   losartan-hydrochlorothiazide (  HYZAAR) 100-25 MG per tablet Take 1 tablet by mouth daily. 90 tablet 1   melatonin 5 MG TABS Take 10-15 mg by mouth at bedtime as needed.     Multiple Vitamin (DAILY VITAMINS) tablet Take by mouth.     nitroGLYCERIN (NITROSTAT) 0.4 MG SL tablet Place 0.4 mg under the tongue as directed. Place 1 tablet (0.4 mg total) under the tongue every 5 (five) minutes as needed for Chest pain May take up to 3 doses.     omeprazole (PRILOSEC) 40 MG capsule TAKE 1 CAPSULE BY MOUTH EVERY DAY 90 capsule 0   oxybutynin (DITROPAN-XL) 10 MG 24 hr tablet Take 1 tablet (10 mg total) by mouth daily. 60 tablet 0   traZODone (DESYREL) 50 MG tablet TAKE 1 TABLET BY MOUTH NIGHTLY     pantoprazole (PROTONIX) 40 MG tablet Take 40 mg by mouth 2  (two) times daily.     telmisartan-hydrochlorothiazide (MICARDIS HCT) 80-25 MG tablet Take 1 tablet by mouth daily.     No current facility-administered medications for this visit.    REVIEW OF SYSTEMS:   [X]  denotes positive finding, [ ]  denotes negative finding Cardiac  Comments:  Chest pain or chest pressure:    Shortness of breath upon exertion:    Short of breath when lying flat:    Irregular heart rhythm:        Vascular    Pain in calf, thigh, or hip brought on by ambulation:    Pain in feet at night that wakes you up from your sleep:     Blood clot in your veins:    Leg swelling:         Pulmonary    Oxygen at home:    Productive cough:     Wheezing:         Neurologic    Sudden weakness in arms or legs:     Sudden numbness in arms or legs:     Sudden onset of difficulty speaking or slurred speech:    Temporary loss of vision in one eye:     Problems with dizziness:         Gastrointestinal    Blood in stool:      Vomited blood:         Genitourinary    Burning when urinating:     Blood in urine:        Psychiatric    Major depression:         Hematologic    Bleeding problems:    Problems with blood clotting too easily:        Skin    Rashes or ulcers:        Constitutional    Fever or chills:     PHYSICAL EXAM:   Vitals:   08/11/22 0924  BP: 116/77  Pulse: 60  Resp: 20  Temp: 98.3 F (36.8 C)  SpO2: 97%  Weight: 167 lb (75.8 kg)  Height: 5\' 5"  (1.651 m)    GENERAL: The patient is a well-nourished female, in no acute distress. The vital signs are documented above. CARDIAC: There is a regular rate and rhythm.  VASCULAR: Palpable left dorsalis pedis pulse, nonpalpable right PULMONARY: Nonlabored respirations MUSCULOSKELETAL: There are no major deformities or cyanosis. NEUROLOGIC: No focal weakness or paresthesias are detected. SKIN: There are no ulcers or rashes noted. PSYCHIATRIC: The patient has a normal affect.  STUDIES:   I  have reviewed the following: +-------+-----------+-----------+------------+------------+  ABI/TBIToday's ABIToday's TBIPrevious  ABIPrevious TBI  +-------+-----------+-----------+------------+------------+  Right 0.53       0.96                                 +-------+-----------+-----------+------------+------------+  Left  0.81       1.13                                 +-------+-----------+-----------+------------+------------+  Right toe pressure:  158 Left toe pressure:  186  ASSESSMENT and PLAN   PAD: The patient does not have symptoms of claudication, however she is planning to have hammertoe surgery later this year.  Her noninvasive imaging suggest that she has adequate blood flow to heal her wound, as her toe pressure on the right is 158.  However, her ABI is 0.53 and her pedal pulses are not palpable, and therefore I question the accuracy of her ABIs.  I told her I would like to see her in 3 months when she has definitively made a plan regarding whether or not she is can have surgery on her hammertoe.  If she decides to have surgery, she will need angiography to fully define her anatomy and intervene if necessary.   Leia Alf, MD, FACS [facility] Specialists of [facility] [phone] Pager [phone]

## 2022-11-10 ENCOUNTER — Ambulatory Visit: Payer: Medicare Other | Admitting: Surgery

## 2022-11-10 NOTE — Progress Notes (Deleted)
Vascular and Vein Specialist of [place]  Patient name: Donna Welch MRN: [id number] DOB: [birth year] Sex: female   REASON FOR VISIT:    Follow up  HISOTRY OF PRESENT ILLNESS:    Donna Welch is a 77 y.o. female who I initially met in January 2024 for evaluation of blood flow to her right foot, in anticipation of having surgery for her hammertoe.  Her noninvasive imaging showed a toe pressure on the right of 158, however her ABI was 0.53.  She did not have palpable pedal pulses.  She was delaying her surgery for another 6 months because she had had stenting in November 2023 for unstable angina.  She was unclear about her antiplatelet regimen.  She denies any symptoms of claudication.  She does not have open wounds.  The patient takes a statin for hypercholesterolemia.  She is medically managed for hypertension.  She is a former smoker.   PAST MEDICAL HISTORY:   Past Medical History:  Diagnosis Date   Allergy    GERD (gastroesophageal reflux disease)    Hyperlipidemia    Hypertension      FAMILY HISTORY:   Family History  Problem Relation Age of Onset   Diabetes Mother    Hypertension Mother    Breast cancer Neg Hx     SOCIAL HISTORY:   Social History   Tobacco Use   Smoking status: Former    Packs/day: .5    Types: Cigarettes   Smokeless tobacco: Never   Tobacco comments:    quit for a year then started back after mother died.  Substance Use Topics   Alcohol use: Yes    Alcohol/week: 14.0 standard drinks of alcohol    Types: 14 Standard drinks or equivalent per week     ALLERGIES:   Allergies  Allergen Reactions   Hydrocodone-Acetaminophen Other (See Comments)    Other reaction(s): Other (See Comments) High blood pressure hyperactivity    Vicodin [Hydrocodone-Acetaminophen] Other (See Comments)    hyperactivity     CURRENT MEDICATIONS:   Current Outpatient Medications  Medication Sig Dispense Refill    amLODipine (NORVASC) 10 MG tablet Take 10 mg by mouth daily.     atorvastatin (LIPITOR) 40 MG tablet Take 40 mg by mouth daily.     clopidogrel (PLAVIX) 75 MG tablet Take 75 mg by mouth daily.     fexofenadine-pseudoephedrine (ALLEGRA-D) 60-120 MG per tablet Take 1 tablet by mouth 2 (two) times daily. (Patient taking differently: Take 1 tablet by mouth 2 (two) times daily as needed.) 30 tablet 5   fluticasone (FLONASE) 50 MCG/ACT nasal spray USE ONE SPRAY IN EACH NOSTRIL TWICE DAILY. (Patient taking differently: 2 sprays daily as needed for allergies.) 1 g 11   losartan-hydrochlorothiazide (HYZAAR) 100-25 MG per tablet Take 1 tablet by mouth daily. 90 tablet 1   melatonin 5 MG TABS Take 10-15 mg by mouth at bedtime as needed.     Multiple Vitamin (DAILY VITAMINS) tablet Take by mouth.     nitroGLYCERIN (NITROSTAT) 0.4 MG SL tablet Place 0.4 mg under the tongue as directed. Place 1 tablet (0.4 mg total) under the tongue every 5 (five) minutes as needed for Chest pain May take up to 3 doses.     omeprazole (PRILOSEC) 40 MG capsule TAKE 1 CAPSULE BY MOUTH EVERY DAY 90 capsule 0   oxybutynin (DITROPAN-XL) 10 MG 24 hr tablet Take 1 tablet (10 mg total) by mouth daily. 60 tablet 0   pantoprazole (PROTONIX) 40  MG tablet Take 40 mg by mouth 2 (two) times daily.     telmisartan-hydrochlorothiazide (MICARDIS HCT) 80-25 MG tablet Take 1 tablet by mouth daily.     traZODone (DESYREL) 50 MG tablet TAKE 1 TABLET BY MOUTH NIGHTLY     No current facility-administered medications for this visit.    REVIEW OF SYSTEMS:   [X]  denotes positive finding, [ ]  denotes negative finding Cardiac  Comments:  Chest pain or chest pressure: ***   Shortness of breath upon exertion:    Short of breath when lying flat:    Irregular heart rhythm:        Vascular    Pain in calf, thigh, or hip brought on by ambulation:    Pain in feet at night that wakes you up from your sleep:     Blood clot in your veins:    Leg  swelling:         Pulmonary    Oxygen at home:    Productive cough:     Wheezing:         Neurologic    Sudden weakness in arms or legs:     Sudden numbness in arms or legs:     Sudden onset of difficulty speaking or slurred speech:    Temporary loss of vision in one eye:     Problems with dizziness:         Gastrointestinal    Blood in stool:     Vomited blood:         Genitourinary    Burning when urinating:     Blood in urine:        Psychiatric    Major depression:         Hematologic    Bleeding problems:    Problems with blood clotting too easily:        Skin    Rashes or ulcers:        Constitutional    Fever or chills:      PHYSICAL EXAM:   There were no vitals filed for this visit.  GENERAL: The patient is a well-nourished female, in no acute distress. The vital signs are documented above. CARDIAC: There is a regular rate and rhythm.  VASCULAR: *** PULMONARY: Non-labored respirations ABDOMEN: Soft and non-tender with normal pitched bowel sounds.  MUSCULOSKELETAL: There are no major deformities or cyanosis. NEUROLOGIC: No focal weakness or paresthesias are detected. SKIN: There are no ulcers or rashes noted. PSYCHIATRIC: The patient has a normal affect.  STUDIES:   ***  MEDICAL ISSUES:   ***    Charlena Cross, MD, FACS [facility] Special[facility] of [facility] [phone] Pager [phone]

## 2022-12-22 ENCOUNTER — Ambulatory Visit: Payer: Medicare Other | Admitting: Podiatry

## 2022-12-29 ENCOUNTER — Ambulatory Visit: Payer: Medicare Other | Admitting: Surgery

## 2022-12-30 ENCOUNTER — Ambulatory Visit: Payer: Medicare Other | Admitting: Podiatry

## 2022-12-30 DIAGNOSIS — M2041 Other hammer toe(s) (acquired), right foot: Secondary | ICD-10-CM

## 2022-12-30 DIAGNOSIS — I739 Peripheral vascular disease, unspecified: Secondary | ICD-10-CM

## 2022-12-30 NOTE — Progress Notes (Signed)
Subjective:  Patient ID: Donna Welch, female    DOB: Dec 29, 1945,  MRN: [id number]  Chief Complaint  Patient presents with   Hammer Toe    Right foot 2nd toe     77 y.o. female presents with the above complaint.  Patient presents with right second digit hammertoe contracture still painful to touch.  Patient is known to Dr. Lilian Kapur.  Patient had questionable flow to the right lower extremity however she had history of MI and could not do surgery for 6 months.  She just went to her cardiologist who cleared her for surgery.  She has not seen the vascular doctor yet.  She denies any other acute complaints   Review of Systems: Negative except as noted in the HPI. Denies N/V/F/Ch.  Past Medical History:  Diagnosis Date   Allergy    GERD (gastroesophageal reflux disease)    Hyperlipidemia    Hypertension     Current Outpatient Medications:    amLODipine (NORVASC) 10 MG tablet, Take 10 mg by mouth daily., Disp: , Rfl:    atorvastatin (LIPITOR) 40 MG tablet, Take 40 mg by mouth daily., Disp: , Rfl:    clopidogrel (PLAVIX) 75 MG tablet, Take 75 mg by mouth daily., Disp: , Rfl:    fexofenadine-pseudoephedrine (ALLEGRA-D) 60-120 MG per tablet, Take 1 tablet by mouth 2 (two) times daily. (Patient taking differently: Take 1 tablet by mouth 2 (two) times daily as needed.), Disp: 30 tablet, Rfl: 5   fluticasone (FLONASE) 50 MCG/ACT nasal spray, USE ONE SPRAY IN EACH NOSTRIL TWICE DAILY. (Patient taking differently: 2 sprays daily as needed for allergies.), Disp: 1 g, Rfl: 11   losartan-hydrochlorothiazide (HYZAAR) 100-25 MG per tablet, Take 1 tablet by mouth daily., Disp: 90 tablet, Rfl: 1   melatonin 5 MG TABS, Take 10-15 mg by mouth at bedtime as needed., Disp: , Rfl:    Multiple Vitamin (DAILY VITAMINS) tablet, Take by mouth., Disp: , Rfl:    nitroGLYCERIN (NITROSTAT) 0.4 MG SL tablet, Place 0.4 mg under the tongue as directed. Place 1 tablet (0.4 mg total) under the tongue every 5 (five)  minutes as needed for Chest pain May take up to 3 doses., Disp: , Rfl:    omeprazole (PRILOSEC) 40 MG capsule, TAKE 1 CAPSULE BY MOUTH EVERY DAY, Disp: 90 capsule, Rfl: 0   oxybutynin (DITROPAN-XL) 10 MG 24 hr tablet, Take 1 tablet (10 mg total) by mouth daily., Disp: 60 tablet, Rfl: 0   pantoprazole (PROTONIX) 40 MG tablet, Take 40 mg by mouth 2 (two) times daily., Disp: , Rfl:    telmisartan-hydrochlorothiazide (MICARDIS HCT) 80-25 MG tablet, Take 1 tablet by mouth daily., Disp: , Rfl:    traZODone (DESYREL) 50 MG tablet, TAKE 1 TABLET BY MOUTH NIGHTLY, Disp: , Rfl:   Social History   Tobacco Use  Smoking Status Former   Packs/day: .5   Types: Cigarettes  Smokeless Tobacco Never  Tobacco Comments   quit for a year then started back after mother died.    Allergies  Allergen Reactions   Hydrocodone-Acetaminophen Other (See Comments)    Other reaction(s): Other (See Comments) High blood pressure hyperactivity    Vicodin [Hydrocodone-Acetaminophen] Other (See Comments)    hyperactivity   Objective:  There were no vitals filed for this visit. There is no height or weight on file to calculate BMI. Constitutional Well developed. Well nourished.  Vascular Dorsalis pedis pulses palpable bilaterally. Posterior tibial pulses palpable bilaterally. Capillary refill normal to all digits.  No  cyanosis or clubbing noted. Pedal hair growth normal.  Neurologic Normal speech. Oriented to person, place, and time. Epicritic sensation to light touch grossly present bilaterally.  Dermatologic Nails well groomed and normal in appearance. No open wounds. No skin lesions.  Orthopedic: Right second digit hammertoe contractures semiflexible in nature with metatarsophalangeal joint contracture.  Painful to touch   Radiographs: None Assessment:   1. Hammertoe of second toe of right foot   2. Peripheral vascular disease (HCC)    Plan:  Patient was evaluated and treated and all questions  answered.  Right second digit hammertoe contracture with underlying second metatarsophalangeal joint contracture -All questions and concerns were discussed with the patient in extensive detail -Patient will need vascular surgery clearance as Dr.Brabham last note states that patient will need angiography prior to undergoing hammertoe correction -She will see vein and vascular for angiography then patient will benefit from hammertoe correction either with me or Dr. Lilian Kapur. -Will see her back again in 3 months  No follow-ups on file.

## 2023-01-01 ENCOUNTER — Telehealth: Payer: Self-pay

## 2023-01-01 NOTE — Telephone Encounter (Signed)
Pt called stating that she needed the ok to get her toe surgery done.  Reviewed pt's chart, returned call for clarification, two identifiers used. Based on Dr. Estanislado Spire last office visit note, he wanted to see her after she committed to the surgery to most likely do a diagnostic angiogram. Appt scheduled. Confirmed understanding.

## 2023-01-02 ENCOUNTER — Ambulatory Visit: Payer: Medicare Other | Admitting: Surgery

## 2023-01-02 ENCOUNTER — Encounter: Payer: Self-pay | Admitting: Surgery

## 2023-01-02 VITALS — BP 120/71 | HR 57 | Temp 98.4°F | Resp 20 | Ht 65.0 in | Wt 168.4 lb

## 2023-01-02 DIAGNOSIS — I739 Peripheral vascular disease, unspecified: Secondary | ICD-10-CM

## 2023-01-02 NOTE — H&P (View-Only) (Signed)
[facility]  Patient name: Donna Welch MRN: [id number] DOB: 1945/11/22 Sex: female   REASON FOR VISIT:    Follow up  HISOTRY OF PRESENT ILLNESS:    Donna Welch is a 77 y.o. female who I met in January 2024 up to evaluate blood flow to her right foot in anticipation of having surgery for her hammertoe.  She is delaying surgery until the summer because she had coronary stenting in November for unstable angina and needs to take antiplatelet therapy.  She had ABIs at that time which were 0.53 on the right and 0.81 on the left.  Toe pressure on the right was 158 and 186 on the left  The patient is on a statin for hypercholesterolemia.  She is medically managed for hypertension.  She is a former smoker.   PAST MEDICAL HISTORY:   Past Medical History:  Diagnosis Date   Allergy    GERD (gastroesophageal reflux disease)    Hyperlipidemia    Hypertension      FAMILY HISTORY:   Family History  Problem Relation Age of Onset   Diabetes Mother    Hypertension Mother    Breast cancer Neg Hx     SOCIAL HISTORY:   Social History   Tobacco Use   Smoking status: Former    Packs/day: .5    Types: Cigarettes   Smokeless tobacco: Never   Tobacco comments:    quit for a year then started back after mother died.  Substance Use Topics   Alcohol use: Yes    Alcohol/week: 14.0 standard drinks of alcohol    Types: 14 Standard drinks or equivalent per week     ALLERGIES:   Allergies  Allergen Reactions   Hydrocodone-Acetaminophen Other (See Comments)    Other reaction(s): Other (See Comments) High blood pressure hyperactivity    Vicodin [Hydrocodone-Acetaminophen] Other (See Comments)    hyperactivity     CURRENT MEDICATIONS:   Current Outpatient Medications  Medication Sig Dispense Refill   amLODipine (NORVASC) 10 MG tablet Take 10 mg by mouth daily.     fexofenadine-pseudoephedrine (ALLEGRA-D)  60-120 MG per tablet Take 1 tablet by mouth 2 (two) times daily. (Patient taking differently: Take 1 tablet by mouth 2 (two) times daily as needed.) 30 tablet 5   fluticasone (FLONASE) 50 MCG/ACT nasal spray USE ONE SPRAY IN EACH NOSTRIL TWICE DAILY. (Patient taking differently: 2 sprays daily as needed for allergies.) 1 g 11   melatonin 5 MG TABS Take 10-15 mg by mouth at bedtime as needed.     Multiple Vitamin (DAILY VITAMINS) tablet Take by mouth.     nitroGLYCERIN (NITROSTAT) 0.4 MG SL tablet Place 0.4 mg under the tongue as directed. Place 1 tablet (0.4 mg total) under the tongue every 5 (five) minutes as needed for Chest pain May take up to 3 doses.     traZODone (DESYREL) 50 MG tablet TAKE 1 TABLET BY MOUTH NIGHTLY     atorvastatin (LIPITOR) 40 MG tablet Take 40 mg by mouth daily. (Patient not taking: Reported on 01/02/2023)     clopidogrel (PLAVIX) 75 MG tablet Take 75 mg by mouth daily. (Patient not taking: Reported on 01/02/2023)     losartan-hydrochlorothiazide (HYZAAR) 100-25 MG per tablet Take 1 tablet by mouth daily. (Patient not taking: Reported on 01/02/2023) 90 tablet 1   omeprazole (PRILOSEC) 40 MG capsule TAKE 1 CAPSULE BY MOUTH EVERY DAY (Patient not taking: Reported on 01/02/2023) 90 capsule 0  oxybutynin (DITROPAN-XL) 10 MG 24 hr tablet Take 1 tablet (10 mg total) by mouth daily. (Patient not taking: Reported on 01/02/2023) 60 tablet 0   pantoprazole (PROTONIX) 40 MG tablet Take 40 mg by mouth 2 (two) times daily.     telmisartan-hydrochlorothiazide (MICARDIS HCT) 80-25 MG tablet Take 1 tablet by mouth daily.     No current facility-administered medications for this visit.    REVIEW OF SYSTEMS:   [X]  denotes positive finding, [ ]  denotes negative finding Cardiac  Comments:  Chest pain or chest pressure:    Shortness of breath upon exertion:    Short of breath when lying flat:    Irregular heart rhythm:        Vascular    Pain in calf, thigh, or hip brought on by  ambulation:    Pain in feet at night that wakes you up from your sleep:     Blood clot in your veins:    Leg swelling:         Pulmonary    Oxygen at home:    Productive cough:     Wheezing:         Neurologic    Sudden weakness in arms or legs:     Sudden numbness in arms or legs:     Sudden onset of difficulty speaking or slurred speech:    Temporary loss of vision in one eye:     Problems with dizziness:         Gastrointestinal    Blood in stool:     Vomited blood:         Genitourinary    Burning when urinating:     Blood in urine:        Psychiatric    Major depression:         Hematologic    Bleeding problems:    Problems with blood clotting too easily:        Skin    Rashes or ulcers:        Constitutional    Fever or chills:      PHYSICAL EXAM:   Vitals:   01/02/23 1341  BP: 120/71  Pulse: (!) 57  Resp: 20  Temp: 98.4 F (36.9 C)  TempSrc: Temporal  SpO2: 96%  Weight: 168 lb 6.4 oz (76.4 kg)  Height: 5\' 5"  (1.651 m)    GENERAL: The patient is a well-nourished female, in no acute distress. The vital signs are documented above. CARDIAC: There is a regular rate and rhythm.  VASCULAR: non-palpable pedal pulses PULMONARY: Non-labored respirations ABDOMEN: Soft and non-tender with normal pitched bowel sounds.  MUSCULOSKELETAL: There are no major deformities or cyanosis. NEUROLOGIC: No focal weakness or paresthesias are detected. SKIN: There are no ulcers or rashes noted. PSYCHIATRIC: The patient has a normal affect.  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 This will be from a left femoral approach wit intervention in the fem-pop region, and possible tibial intervention.  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 The patient is on best medical therapy for peripheral arterial disease. The patient has been counseled about the risks of tobacco use in atherosclerotic disease. The patient has been counseled to abstain from any tobacco use. An aortogram with bilateral lower extremity runoff angiography and Bilateral lower extremity intervention and is indicated to better evaluate the patient's lower extremity circulation because of the potential limb threatening nature of the patient's diagnosis and need for hammertoe surgery. Based on the patient's clinical exam and non-invasive data, we anticipate an endovascular intervention in the femoral-popliteal and tibial vessels.  Stenting and / or athrectomy would be favored because of the improved primary patency of these interventions as compared to plain balloon angioplasty.     Charlena Cross, MD, FACS [facility] [phone] Pager [phone]

## 2023-01-02 NOTE — Progress Notes (Signed)
[facility]  Patient name: Donna Welch MRN: [id number] DOB: 1945/11/22 Sex: female   REASON FOR VISIT:    Follow up  HISOTRY OF PRESENT ILLNESS:    Donna Welch is a 77 y.o. female who I met in January 2024 up to evaluate blood flow to her right foot in anticipation of having surgery for her hammertoe.  She is delaying surgery until the summer because she had coronary stenting in November for unstable angina and needs to take antiplatelet therapy.  She had ABIs at that time which were 0.53 on the right and 0.81 on the left.  Toe pressure on the right was 158 and 186 on the left  The patient is on a statin for hypercholesterolemia.  She is medically managed for hypertension.  She is a former smoker.   PAST MEDICAL HISTORY:   Past Medical History:  Diagnosis Date   Allergy    GERD (gastroesophageal reflux disease)    Hyperlipidemia    Hypertension      FAMILY HISTORY:   Family History  Problem Relation Age of Onset   Diabetes Mother    Hypertension Mother    Breast cancer Neg Hx     SOCIAL HISTORY:   Social History   Tobacco Use   Smoking status: Former    Packs/day: .5    Types: Cigarettes   Smokeless tobacco: Never   Tobacco comments:    quit for a year then started back after mother died.  Substance Use Topics   Alcohol use: Yes    Alcohol/week: 14.0 standard drinks of alcohol    Types: 14 Standard drinks or equivalent per week     ALLERGIES:   Allergies  Allergen Reactions   Hydrocodone-Acetaminophen Other (See Comments)    Other reaction(s): Other (See Comments) High blood pressure hyperactivity    Vicodin [Hydrocodone-Acetaminophen] Other (See Comments)    hyperactivity     CURRENT MEDICATIONS:   Current Outpatient Medications  Medication Sig Dispense Refill   amLODipine (NORVASC) 10 MG tablet Take 10 mg by mouth daily.     fexofenadine-pseudoephedrine (ALLEGRA-D)  60-120 MG per tablet Take 1 tablet by mouth 2 (two) times daily. (Patient taking differently: Take 1 tablet by mouth 2 (two) times daily as needed.) 30 tablet 5   fluticasone (FLONASE) 50 MCG/ACT nasal spray USE ONE SPRAY IN EACH NOSTRIL TWICE DAILY. (Patient taking differently: 2 sprays daily as needed for allergies.) 1 g 11   melatonin 5 MG TABS Take 10-15 mg by mouth at bedtime as needed.     Multiple Vitamin (DAILY VITAMINS) tablet Take by mouth.     nitroGLYCERIN (NITROSTAT) 0.4 MG SL tablet Place 0.4 mg under the tongue as directed. Place 1 tablet (0.4 mg total) under the tongue every 5 (five) minutes as needed for Chest pain May take up to 3 doses.     traZODone (DESYREL) 50 MG tablet TAKE 1 TABLET BY MOUTH NIGHTLY     atorvastatin (LIPITOR) 40 MG tablet Take 40 mg by mouth daily. (Patient not taking: Reported on 01/02/2023)     clopidogrel (PLAVIX) 75 MG tablet Take 75 mg by mouth daily. (Patient not taking: Reported on 01/02/2023)     losartan-hydrochlorothiazide (HYZAAR) 100-25 MG per tablet Take 1 tablet by mouth daily. (Patient not taking: Reported on 01/02/2023) 90 tablet 1   omeprazole (PRILOSEC) 40 MG capsule TAKE 1 CAPSULE BY MOUTH EVERY DAY (Patient not taking: Reported on 01/02/2023) 90 capsule 0  oxybutynin (DITROPAN-XL) 10 MG 24 hr tablet Take 1 tablet (10 mg total) by mouth daily. (Patient not taking: Reported on 01/02/2023) 60 tablet 0   pantoprazole (PROTONIX) 40 MG tablet Take 40 mg by mouth 2 (two) times daily.     telmisartan-hydrochlorothiazide (MICARDIS HCT) 80-25 MG tablet Take 1 tablet by mouth daily.     No current facility-administered medications for this visit.    REVIEW OF SYSTEMS:   [X]  denotes positive finding, [ ]  denotes negative finding Cardiac  Comments:  Chest pain or chest pressure:    Shortness of breath upon exertion:    Short of breath when lying flat:    Irregular heart rhythm:        Vascular    Pain in calf, thigh, or hip brought on by  ambulation:    Pain in feet at night that wakes you up from your sleep:     Blood clot in your veins:    Leg swelling:         Pulmonary    Oxygen at home:    Productive cough:     Wheezing:         Neurologic    Sudden weakness in arms or legs:     Sudden numbness in arms or legs:     Sudden onset of difficulty speaking or slurred speech:    Temporary loss of vision in one eye:     Problems with dizziness:         Gastrointestinal    Blood in stool:     Vomited blood:         Genitourinary    Burning when urinating:     Blood in urine:        Psychiatric    Major depression:         Hematologic    Bleeding problems:    Problems with blood clotting too easily:        Skin    Rashes or ulcers:        Constitutional    Fever or chills:      PHYSICAL EXAM:   Vitals:   01/02/23 1341  BP: 120/71  Pulse: (!) 57  Resp: 20  Temp: 98.4 F (36.9 C)  TempSrc: Temporal  SpO2: 96%  Weight: 168 lb 6.4 oz (76.4 kg)  Height: 5\' 5"  (1.651 m)    GENERAL: The patient is a well-nourished female, in no acute distress. The vital signs are documented above. CARDIAC: There is a regular rate and rhythm.  VASCULAR: non-palpable pedal pulses PULMONARY: Non-labored respirations ABDOMEN: Soft and non-tender with normal pitched bowel sounds.  MUSCULOSKELETAL: There are no major deformities or cyanosis. NEUROLOGIC: No focal weakness or paresthesias are detected. SKIN: There are no ulcers or rashes noted. PSYCHIATRIC: The patient has a normal affect.  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 This will be from a left femoral approach wit intervention in the fem-pop region, and possible tibial intervention.  [name] has atherosclerosis of the native arteries of the Bilateral lower extremities causing no symptoms. The patient is on best medical therapy for peripheral arterial disease. The patient has been counseled about the risks of tobacco use in atherosclerotic disease. The patient has been counseled to abstain from any tobacco use. An aortogram with bilateral lower extremity runoff angiography and Bilateral lower extremity intervention and is indicated to better evaluate the patient's lower extremity circulation because of the potential limb threatening nature of the patient's diagnosis and need for hammertoe surgery. Based on the patient's clinical exam and non-invasive data, we anticipate an endovascular intervention in the femoral-popliteal and tibial vessels.  Stenting and / or athrectomy would be favored because of the improved primary patency of these interventions as compared to plain balloon angioplasty.     Charlena Cross, MD, FACS [facility] [phone] Pager [phone]

## 2023-01-04 ENCOUNTER — Encounter: Payer: Self-pay | Admitting: Surgery

## 2023-01-05 ENCOUNTER — Other Ambulatory Visit: Payer: Self-pay

## 2023-01-05 DIAGNOSIS — I739 Peripheral vascular disease, unspecified: Secondary | ICD-10-CM

## 2023-01-13 ENCOUNTER — Ambulatory Visit (HOSPITAL_COMMUNITY)
Admission: RE | Admit: 2023-01-13 | Discharge: 2023-01-13 | Disposition: A | Discharge status: Home / Self Care | Payer: Medicare Other | Attending: Surgery | Admitting: Surgery

## 2023-01-13 ENCOUNTER — Encounter (HOSPITAL_COMMUNITY)
Admission: RE | Disposition: A | Discharge status: Home / Self Care | Payer: Self-pay | Source: Home / Self Care | Attending: Surgery

## 2023-01-13 ENCOUNTER — Other Ambulatory Visit: Payer: Self-pay

## 2023-01-13 DIAGNOSIS — Z955 Presence of coronary angioplasty implant and graft: Secondary | ICD-10-CM | POA: Insufficient documentation

## 2023-01-13 DIAGNOSIS — Z87891 Personal history of nicotine dependence: Secondary | ICD-10-CM | POA: Diagnosis not present

## 2023-01-13 DIAGNOSIS — I10 Essential (primary) hypertension: Secondary | ICD-10-CM | POA: Insufficient documentation

## 2023-01-13 DIAGNOSIS — I25110 Atherosclerotic heart disease of native coronary artery with unstable angina pectoris: Secondary | ICD-10-CM | POA: Diagnosis not present

## 2023-01-13 DIAGNOSIS — Z01818 Encounter for other preprocedural examination: Secondary | ICD-10-CM | POA: Diagnosis not present

## 2023-01-13 DIAGNOSIS — M2041 Other hammer toe(s) (acquired), right foot: Secondary | ICD-10-CM

## 2023-01-13 DIAGNOSIS — I70211 Atherosclerosis of native arteries of extremities with intermittent claudication, right leg: Secondary | ICD-10-CM

## 2023-01-13 DIAGNOSIS — I739 Peripheral vascular disease, unspecified: Secondary | ICD-10-CM

## 2023-01-13 DIAGNOSIS — E7800 Pure hypercholesterolemia, unspecified: Secondary | ICD-10-CM | POA: Insufficient documentation

## 2023-01-13 HISTORY — PX: PERIPHERAL VASCULAR INTERVENTION: CATH118257

## 2023-01-13 HISTORY — PX: ABDOMINAL AORTOGRAM W/LOWER EXTREMITY: CATH118223

## 2023-01-13 LAB — POCT I-STAT, CHEM 8
BUN: 8 mg/dL (ref 8–23)
Calcium, Ion: 1.19 mmol/L (ref 1.15–1.40)
Chloride: 104 mmol/L (ref 98–111)
Creatinine, Ser: 0.9 mg/dL (ref 0.44–1.00)
Glucose, Bld: 98 mg/dL (ref 70–99)
HCT: 42 % (ref 36.0–46.0)
Hemoglobin: 14.3 g/dL (ref 12.0–15.0)
Potassium: 4.1 mmol/L (ref 3.5–5.1)
Sodium: 139 mmol/L (ref 135–145)
TCO2: 28 mmol/L (ref 22–32)

## 2023-01-13 SURGERY — ABDOMINAL AORTOGRAM W/LOWER EXTREMITY
Anesthesia: LOCAL

## 2023-01-13 MED ORDER — IODIXANOL 320 MG/ML IV SOLN
INTRAVENOUS | Status: DC | PRN
  Administered 2023-01-13: 110 mL

## 2023-01-13 MED ORDER — ROSUVASTATIN CALCIUM 10 MG PO TABS: 10.0000 mg | ORAL_TABLET | Freq: Every day | ORAL | 11 refills | Status: AC

## 2023-01-13 MED ORDER — ONDANSETRON HCL 4 MG/2ML IJ SOLN: 4.0000 mg | Freq: Four times a day (QID) | INTRAMUSCULAR | Status: DC | PRN

## 2023-01-13 MED ORDER — ASPIRIN 81 MG PO CHEW
CHEWABLE_TABLET | ORAL | Status: DC | PRN
  Administered 2023-01-13: 81 mg via ORAL

## 2023-01-13 MED ORDER — HEPARIN SODIUM (PORCINE) 1000 UNIT/ML IJ SOLN
INTRAMUSCULAR | Status: AC
  Filled 2023-01-13: qty 10

## 2023-01-13 MED ORDER — FENTANYL CITRATE (PF) 100 MCG/2ML IJ SOLN
INTRAMUSCULAR | Status: AC
  Filled 2023-01-13: qty 2

## 2023-01-13 MED ORDER — SODIUM CHLORIDE 0.9 % IV SOLN: INTRAVENOUS | Status: DC

## 2023-01-13 MED ORDER — OXYCODONE HCL 5 MG PO TABS: 5.0000 mg | ORAL_TABLET | ORAL | Status: DC | PRN

## 2023-01-13 MED ORDER — MIDAZOLAM HCL 2 MG/2ML IJ SOLN
INTRAMUSCULAR | Status: AC
  Filled 2023-01-13: qty 2

## 2023-01-13 MED ORDER — LIDOCAINE HCL (PF) 1 % IJ SOLN
INTRAMUSCULAR | Status: AC
  Filled 2023-01-13: qty 30

## 2023-01-13 MED ORDER — CLOPIDOGREL BISULFATE 75 MG PO TABS: 75.0000 mg | ORAL_TABLET | Freq: Every day | ORAL | Status: DC

## 2023-01-13 MED ORDER — ASPIRIN 81 MG PO TBEC: 81.0000 mg | DELAYED_RELEASE_TABLET | Freq: Every day | ORAL | Status: DC

## 2023-01-13 MED ORDER — MORPHINE SULFATE (PF) 2 MG/ML IV SOLN: 2.0000 mg | INTRAVENOUS | Status: DC | PRN

## 2023-01-13 MED ORDER — HEPARIN SODIUM (PORCINE) 1000 UNIT/ML IJ SOLN
INTRAMUSCULAR | Status: DC | PRN
  Administered 2023-01-13: 7000 [IU] via INTRAVENOUS

## 2023-01-13 MED ORDER — HEPARIN (PORCINE) IN NACL 1000-0.9 UT/500ML-% IV SOLN
INTRAVENOUS | Status: DC | PRN
  Administered 2023-01-13 (×2): 500 mL

## 2023-01-13 MED ORDER — FENTANYL CITRATE (PF) 100 MCG/2ML IJ SOLN
INTRAMUSCULAR | Status: DC | PRN
  Administered 2023-01-13: 50 ug via INTRAVENOUS

## 2023-01-13 MED ORDER — CLOPIDOGREL BISULFATE 75 MG PO TABS: 300.0000 mg | ORAL_TABLET | Freq: Once | ORAL | Status: DC

## 2023-01-13 MED ORDER — MIDAZOLAM HCL 2 MG/2ML IJ SOLN
INTRAMUSCULAR | Status: DC | PRN
  Administered 2023-01-13: 2 mg via INTRAVENOUS

## 2023-01-13 MED ORDER — HYDRALAZINE HCL 20 MG/ML IJ SOLN
INTRAMUSCULAR | Status: AC
  Filled 2023-01-13: qty 1

## 2023-01-13 MED ORDER — LABETALOL HCL 5 MG/ML IV SOLN: 10.0000 mg | INTRAVENOUS | Status: DC | PRN

## 2023-01-13 MED ORDER — ASPIRIN 81 MG PO CHEW
CHEWABLE_TABLET | ORAL | Status: AC
  Filled 2023-01-13: qty 1

## 2023-01-13 MED ORDER — CLOPIDOGREL BISULFATE 300 MG PO TABS
ORAL_TABLET | ORAL | Status: DC | PRN
  Administered 2023-01-13: 300 mg via ORAL

## 2023-01-13 MED ORDER — ASPIRIN 81 MG PO TBEC: 81.0000 mg | DELAYED_RELEASE_TABLET | Freq: Every day | ORAL | 12 refills | Status: AC

## 2023-01-13 MED ORDER — CLOPIDOGREL BISULFATE 300 MG PO TABS
ORAL_TABLET | ORAL | Status: AC
  Filled 2023-01-13: qty 1

## 2023-01-13 MED ORDER — LIDOCAINE HCL (PF) 1 % IJ SOLN
INTRAMUSCULAR | Status: DC | PRN
  Administered 2023-01-13: 20 mL

## 2023-01-13 MED ORDER — HYDRALAZINE HCL 20 MG/ML IJ SOLN: 5.0000 mg | INTRAMUSCULAR | Status: DC | PRN

## 2023-01-13 MED ORDER — HYDRALAZINE HCL 20 MG/ML IJ SOLN
INTRAMUSCULAR | Status: DC | PRN
  Administered 2023-01-13: 10 mg via INTRAVENOUS

## 2023-01-13 SURGICAL SUPPLY — 24 items
BALLN MUSTANG 5X100X135 (BALLOONS) ×2
BALLOON MUSTANG 5X100X135 (BALLOONS) IMPLANT
CATH OMNI FLUSH 5F 65CM (CATHETERS) IMPLANT
CATH QUICKCROSS SUPP .035X90CM (MICROCATHETER) IMPLANT
COVER DOME SNAP 22 D (MISCELLANEOUS) IMPLANT
DEVICE TORQUE SEADRAGON GRN (MISCELLANEOUS) IMPLANT
DEVICE VASC CLSR CELT ART 6 (Vascular Products) IMPLANT
GUIDEWIRE ANGLED .035X150CM (WIRE) IMPLANT
KIT ENCORE 26 ADVANTAGE (KITS) IMPLANT
KIT MICROPUNCTURE NIT STIFF (SHEATH) IMPLANT
KIT PV (KITS) ×2 IMPLANT
SHEATH CATAPULT 6FR 45 (SHEATH) IMPLANT
SHEATH PINNACLE 5F 10CM (SHEATH) IMPLANT
SHEATH PINNACLE 6F 10CM (SHEATH) IMPLANT
SHEATH PROBE COVER 6X72 (BAG) IMPLANT
STENT ELUVIA 6X150X130 (Permanent Stent) IMPLANT
STENT ELUVIA 6X80X130 (Permanent Stent) IMPLANT
STOPCOCK MORSE 400PSI 3WAY (MISCELLANEOUS) IMPLANT
SYR MEDRAD MARK 7 150ML (SYRINGE) ×2 IMPLANT
TRANSDUCER W/STOPCOCK (MISCELLANEOUS) ×2 IMPLANT
TRAY PV CATH (CUSTOM PROCEDURE TRAY) ×2 IMPLANT
TUBING CIL FLEX 10 FLL-RA (TUBING) IMPLANT
WIRE BENTSON .035X145CM (WIRE) IMPLANT
WIRE HI TORQ VERSACORE 300 (WIRE) IMPLANT

## 2023-01-13 NOTE — Interval H&P Note (Signed)
History and Physical Interval Note:  01/13/2023 9:23 AM  Donna Welch  has presented today for surgery, with the diagnosis of pad.  The various methods of treatment have been discussed with the patient and family. After consideration of risks, benefits and other options for treatment, the patient has consented to  Procedure(s): ABDOMINAL AORTOGRAM W/LOWER EXTREMITY (N/A) as a surgical intervention.  The patient's history has been reviewed, patient examined, no change in status, stable for surgery.  I have reviewed the patient's chart and labs.  Questions were answered to the patient's satisfaction.     [name]

## 2023-01-13 NOTE — Progress Notes (Signed)
Pt ambulated to and from bathroom to void with no signs of oozing from left groin site  

## 2023-01-13 NOTE — Op Note (Signed)
Patient name: Donna Welch MRN: [id number] DOB: 05/12/1946 Sex: female  01/13/2023 Pre-operative Diagnosis: Right leg claudication Post-operative diagnosis:  Same Surgeon:  [name] Procedure Performed:  1.  Ultrasound-guided access, left femoral artery  2.  Abdominal aortogram  3.  Right leg angiogram  4.  Selective injection with catheter in the right superficial femoral artery from the left common femoral artery  5.  Stent, right superficial femoral artery  6.  Conscious sedation, 55 minutes  7.  Closure device, Celt   Indications: This is a 77 year old female who is in need of foot surgery for hammertoe.  She has significant PAD on ultrasound and so she comes in today for angiographic evaluation  Procedure:  The patient was identified in the holding area and taken to room 8.  The patient was then placed supine on the table and prepped and draped in the usual sterile fashion.  A time out was called.  Conscious sedation was administered with the use of IV fentanyl and Versed under continuous physician and nurse monitoring.  Heart rate, blood pressure, and oxygen saturation were continuously monitored.  Total sedation time was 55 minutes.  Ultrasound was used to evaluate the left common femoral artery.  It was patent .  A digital ultrasound image was acquired.  A micropuncture needle was used to access the left common femoral artery under ultrasound guidance.  An 018 wire was advanced without resistance and a micropuncture sheath was placed.  The 018 wire was removed and a benson wire was placed.  The micropuncture sheath was exchanged for a 5 french sheath.  An omniflush catheter was advanced over the wire to the level of L-1.  An abdominal angiogram was obtained.  Next, using the omniflush catheter and a benson wire, the aortic bifurcation was crossed and the catheter was placed into theright external iliac artery and right runoff was obtained.    Findings:   Aortogram: Significant  left renal artery stenosis.  The infrarenal abdominal aorta is widely patent.  Bilateral common and external iliac arteries are patent without significant stenosis.  The right common femoral is patent without significant stenosis.  The left common femoral is patent without significant stenosis  Right Lower Extremity: The right common femoral, profundofemoral artery are widely patent.  The superficial femoral artery is small in caliber.  It is occluded in its midportion for approximately 15 cm with reconstitution of the above-knee popliteal artery.  The dominant runoff vessel is the anterior tibial artery.  There is diffuse disease of the foot  Left Lower Extremity: Not evaluated  Intervention: After the above images were acquired the decision was made to proceed with intervention.  A 6 French 45 cm sheath was advanced into the right superficial femoral artery.  Additional contrast injections were performed with the catheter in the right superficial femoral artery to better define the anatomy.  Patient was fully heparinized.  Using an 035 Glidewire and a quick cross catheter, subintimal recanalization was performed.  Reentry was confirmed with contrast injection of the popliteal artery.  A 035 versa core wire was then placed.  The subintimal tract was dilated with a 5 mm balloon.  I then placed a 6 x 150 followed by overlapping 6 x 80 Eluvia stent which was postdilated with a 5 mm balloon.  Completion imaging showed resolution of the stenosis and inline flow through the superficial femoral and popliteal artery with single-vessel runoff that was unchanged.  The groin was then closed with a  Celt  Impression:  #1  High-grade left renal artery stenosis  #2  Occluded right superficial femoral artery successfully recanalized and stented using overlapping 6 mm drug-coated Eluvia stents  #3  Single-vessel runoff via the anterior tibial artery with diffuse disease about the foot  #4  The patient has been optimized  from a vascular perspective and should proceed with her foot surgery    V. [name], M.D., [facility] [place] Office: [phone] Pager:  [phone]

## 2023-01-14 ENCOUNTER — Telehealth: Payer: Self-pay

## 2023-01-14 ENCOUNTER — Encounter (HOSPITAL_COMMUNITY): Payer: Self-pay | Admitting: Surgery

## 2023-01-14 NOTE — Telephone Encounter (Signed)
Pt called asking if she should take OTC Tylenol for pain.  Reviewed pt's chart, returned call for clarification, two identifiers used. Pt was in line at the bank and stated that she would call back.

## 2023-01-23 ENCOUNTER — Other Ambulatory Visit: Payer: Self-pay | Admitting: Internal Medicine

## 2023-01-23 DIAGNOSIS — Z1231 Encounter for screening mammogram for malignant neoplasm of breast: Secondary | ICD-10-CM

## 2023-01-26 ENCOUNTER — Other Ambulatory Visit: Payer: Self-pay | Admitting: *Deleted

## 2023-01-26 DIAGNOSIS — I739 Peripheral vascular disease, unspecified: Secondary | ICD-10-CM

## 2023-02-07 ENCOUNTER — Other Ambulatory Visit: Payer: Self-pay | Admitting: Internal Medicine

## 2023-02-07 ENCOUNTER — Ambulatory Visit
Admission: RE | Admit: 2023-02-07 | Discharge: 2023-02-07 | Disposition: A | Discharge status: Home / Self Care | Payer: Medicare Other | Source: Ambulatory Visit | Attending: Internal Medicine | Admitting: Internal Medicine

## 2023-02-07 DIAGNOSIS — R609 Edema, unspecified: Secondary | ICD-10-CM | POA: Diagnosis present

## 2023-02-16 ENCOUNTER — Ambulatory Visit (HOSPITAL_COMMUNITY)
Admission: RE | Admit: 2023-02-16 | Discharge: 2023-02-16 | Disposition: A | Discharge status: Home / Self Care | Payer: Medicare Other | Source: Ambulatory Visit | Attending: Vascular Surgery | Admitting: Vascular Surgery

## 2023-02-16 ENCOUNTER — Ambulatory Visit (INDEPENDENT_AMBULATORY_CARE_PROVIDER_SITE_OTHER)
Admission: RE | Admit: 2023-02-16 | Discharge: 2023-02-16 | Disposition: A | Discharge status: Home / Self Care | Payer: Medicare Other | Source: Ambulatory Visit | Attending: Vascular Surgery | Admitting: Vascular Surgery

## 2023-02-16 ENCOUNTER — Ambulatory Visit (INDEPENDENT_AMBULATORY_CARE_PROVIDER_SITE_OTHER): Payer: Medicare Other | Admitting: Physician Assistant

## 2023-02-16 VITALS — BP 131/78 | HR 54 | Temp 97.9°F | Resp 18 | Ht 65.0 in | Wt 163.0 lb

## 2023-02-16 DIAGNOSIS — I739 Peripheral vascular disease, unspecified: Secondary | ICD-10-CM | POA: Insufficient documentation

## 2023-02-16 LAB — VAS US ABI WITH/WO TBI
Left ABI: 0.81
Right ABI: 0.91

## 2023-02-16 NOTE — Progress Notes (Signed)
Office Note     CC:  follow up Requesting Provider:  Marguarite Arbour, MD  HPI: Donna Welch is a 77 y.o. (August 02, 1945) female who presents with her sister for follow up of PAD. She recently underwent Aortogram, Arteriogram of RLE with right SFA stenting on 01/13/23 by Dr. Myra Gianotti. This was performed due to lifestyle limiting claudication and also planning for surgery on hammertoe of 2nd toe. She was found to have single vessel AT runoff with diffuse disease in the foot.   Today she reports that she still is having pain behind her right knee in the proximal calf and distal thigh.She says this feels no different since the procedure. This mostly occurs when she is trying to life or move her leg. She describes it as a deep pulling sensation. Sometimes causes her to limp while walking. Otherwise she says she does not have any pain on ambulation or at rest. No tissue loss. Her hammer toe is not painful but just bothersome in trying to wear shoes. She has had some right foot/ ankle swelling since her procedure. She says she does not regularly elevate her legs. She denies any pain in her LLE. She is medically managed on Statin, Aspirin and Plavix  Past Medical History:  Diagnosis Date   Allergy    GERD (gastroesophageal reflux disease)    Hyperlipidemia    Hypertension     Past Surgical History:  Procedure Laterality Date   ABDOMINAL AORTOGRAM W/LOWER EXTREMITY N/A 01/13/2023   Procedure: ABDOMINAL AORTOGRAM W/LOWER EXTREMITY;  Surgeon: Nada Libman, MD;  Location: [facility];  Service: Cardiovascular;  Laterality: N/A;   ABDOMINAL HYSTERECTOMY     COLONOSCOPY Bilateral    COLONOSCOPY WITH PROPOFOL N/A 12/23/2021   Procedure: COLONOSCOPY WITH PROPOFOL;  Surgeon: Regis Bill, MD;  Location: [facility];  Service: Endoscopy;  Laterality: N/A;   PERIPHERAL VASCULAR INTERVENTION  01/13/2023   Procedure: PERIPHERAL VASCULAR INTERVENTION;  Surgeon: Nada Libman, MD;  Location:  [facility];  Service: Cardiovascular;;    Social History   Socioeconomic History   Marital status: Widowed    Spouse name: Not on file   Number of children: Not on file   Years of education: Not on file   Highest education level: Not on file  Occupational History   Not on file  Tobacco Use   Smoking status: Former    Current packs/day: 0.50    Types: Cigarettes   Smokeless tobacco: Never   Tobacco comments:    quit for a year then started back after mother died.  Vaping Use   Vaping status: Never Used  Substance and Sexual Activity   Alcohol use: Yes    Alcohol/week: 14.0 standard drinks of alcohol    Types: 14 Standard drinks or equivalent per week   Drug use: Yes    Types: Marijuana   Sexual activity: Yes    Partners: Male  Other Topics Concern   Not on file  Social History Narrative   Not on file   Social Determinants of Health   Financial Resource Strain: Not on file  Food Insecurity: No Food Insecurity (07/06/2019)   [facility], [facility]   Hunger Vital Sign    Worried About Running Out of Food in the Last Year: Never true    Ran Out of Food in the Last Year: Never true  Transportation Needs: Not on file  Physical Activity: Not on file  Stress:  Not on file  Social Connections: Not on file  Intimate Partner Violence: Not on file    Family History  Problem Relation Age of Onset   Diabetes Mother    Hypertension Mother    Breast cancer Neg Hx     Current Outpatient Medications  Medication Sig Dispense Refill   acetaminophen (TYLENOL) 650 MG CR tablet Take 1,300 mg by mouth every 8 (eight) hours as needed for pain.     amLODipine (NORVASC) 10 MG tablet Take 10 mg by mouth daily.     aspirin EC 81 MG tablet Take 1 tablet (81 mg total) by mouth daily. Swallow whole. 30 tablet 12   B Complex-C (B-COMPLEX WITH VITAMIN C) tablet Take 1 tablet by mouth daily.     cholecalciferol (VITAMIN D3) 25 MCG (1000 UNIT) tablet Take  1,000 Units by mouth daily.     clopidogrel (PLAVIX) 75 MG tablet Take 75 mg by mouth daily.     isosorbide mononitrate (IMDUR) 30 MG 24 hr tablet Take 30 mg by mouth daily.     melatonin 5 MG TABS Take 10-15 mg by mouth at bedtime as needed (leep).     Mometasone Furoate (ALLERGY NASAL SPRAY NA) Place 1 spray into the nose daily as needed (allergies).     Multiple Vitamin (DAILY VITAMINS) tablet Take 1 tablet by mouth daily. Centrum     Multiple Vitamins-Minerals (PRESERVISION AREDS 2 PO) Take 1 tablet by mouth daily.     nitroGLYCERIN (NITROSTAT) 0.4 MG SL tablet Place 0.4 mg under the tongue as directed. Place 1 tablet (0.4 mg total) under the tongue every 5 (five) minutes as needed for Chest pain May take up to 3 doses.     Omega-3 Fatty Acids (FISH OIL) 1200 MG CAPS Take 1,200 mg by mouth daily.     oxybutynin (DITROPAN-XL) 10 MG 24 hr tablet Take 1 tablet (10 mg total) by mouth daily. 60 tablet 0   Polyethyl Glycol-Propyl Glycol (SYSTANE ULTRA) 0.4-0.3 % SOLN Place 1 drop into both eyes 2 (two) times daily.     rosuvastatin (CRESTOR) 10 MG tablet Take 1 tablet (10 mg total) by mouth daily. 30 tablet 11   traZODone (DESYREL) 100 MG tablet Take 100 mg by mouth at bedtime as needed for sleep.     vitamin E 1000 UNIT capsule Take 1,000 Units by mouth daily.     telmisartan-hydrochlorothiazide (MICARDIS HCT) 80-25 MG tablet Take 1 tablet by mouth daily.     No current facility-administered medications for this visit.    Allergies  Allergen Reactions   Hydrocodone-Acetaminophen Other (See Comments)    Other reaction(s): Other (See Comments) High blood pressure hyperactivity    Vicodin [Hydrocodone-Acetaminophen] Other (See Comments)    hyperactivity     REVIEW OF SYSTEMS:   [X]  denotes positive finding, [ ]  denotes negative finding Cardiac  Comments:  Chest pain or chest pressure:    Shortness of breath upon exertion:    Short of breath when lying flat:    Irregular heart  rhythm:        Vascular    Pain in calf, thigh, or hip brought on by ambulation:    Pain in feet at night that wakes you up from your sleep:     Blood clot in your veins:    Leg swelling:         Pulmonary    Oxygen at home:    Productive cough:     Wheezing:  Neurologic    Sudden weakness in arms or legs:     Sudden numbness in arms or legs:     Sudden onset of difficulty speaking or slurred speech:    Temporary loss of vision in one eye:     Problems with dizziness:         Gastrointestinal    Blood in stool:     Vomited blood:         Genitourinary    Burning when urinating:     Blood in urine:        Psychiatric    Major depression:         Hematologic    Bleeding problems:    Problems with blood clotting too easily:        Skin    Rashes or ulcers:        Constitutional    Fever or chills:      PHYSICAL EXAMINATION:  Vitals:   02/16/23 0827  BP: 131/78  Pulse: (!) 54  Resp: 18  Temp: 97.9 F (36.6 C)  TempSrc: Temporal  SpO2: 97%  Weight: 163 lb (73.9 kg)  Height: 5\' 5"  (1.651 m)    General:  WDWN in NAD; vital signs documented above Gait: Normal HENT: WNL, normocephalic Pulmonary: normal non-labored breathing without wheezing Cardiac: regular HR Abdomen: soft, NT Vascular Exam/Pulses: 2+ femoral, 2+ DP pulses bilaterally Extremities: without ischemic changes, without Gangrene , without cellulitis; without open wounds;  Musculoskeletal: no muscle wasting or atrophy  Neurologic: A&O X 3 Psychiatric:  The pt has Normal affect.   Non-Invasive Vascular Imaging:   +-------+-----------+-----------+------------+------------+  ABI/TBIToday's ABIToday's TBIPrevious ABIPrevious TBI  +-------+-----------+-----------+------------+------------+  Right 0.91       0.48       0.53        0.96          +-------+-----------+-----------+------------+------------+  Left  0.81       0.6        0.81        1.13           +-------+-----------+-----------+------------+------------+      ASSESSMENT/PLAN:: 77 y.o. female here for follow up of PAD. She recently underwent Aortogram, Arteriogram of RLE with right SFA stenting on 01/13/23 by Dr. Myra Gianotti. This was performed due to lifestyle limiting claudication and also planning for surgery on hammertoe. She was found to have single vessel AT runoff with diffuse disease in the foot. She is still having pain behind right knee. She is  not having typical claudication symptoms. I suspect this is more musculoskeletal. She is not having any rest pain or tissue loss.  - He RLE blood flow is optimized from a vascular standpoint - ABI today is increased on the right however TBI decreased since prior study. Left remains essentially unchanged - Discussed with her that she is still at some risk with having any surgery on her toes due to her arterial disease and would hold off unless her toe is really bothering her. Discussed importance of keeping her feet protected in general because she is at risk of wound healing -Encourage walking more - Continue Aspirin, Statin, Plavix - She will follow up again in 3 months with  RLE duplex and ABI   Graceann Congress, PA-C [facility] [phone]  Call MD:   Karin Lieu

## 2023-02-20 ENCOUNTER — Other Ambulatory Visit: Payer: Self-pay

## 2023-02-20 DIAGNOSIS — I739 Peripheral vascular disease, unspecified: Secondary | ICD-10-CM

## 2023-03-04 ENCOUNTER — Ambulatory Visit
Admission: RE | Admit: 2023-03-04 | Discharge: 2023-03-04 | Disposition: A | Discharge status: Home / Self Care | Payer: Medicare Other | Source: Ambulatory Visit | Attending: Internal Medicine | Admitting: Internal Medicine

## 2023-03-04 DIAGNOSIS — Z1231 Encounter for screening mammogram for malignant neoplasm of breast: Secondary | ICD-10-CM | POA: Diagnosis present

## 2023-03-06 ENCOUNTER — Telehealth: Payer: Self-pay

## 2023-03-06 NOTE — Telephone Encounter (Signed)
Spoke with pt in response to her mychart message regarding her pain in her leg that has not been improving. Per APP office visit note, this was thought to be more musculoskeletal. I have advised pt of this and read her the note. Advised pt to f/u with PCP who may consider an ortho referral. Pt had no further questions/concerns at this time.

## 2023-05-18 ENCOUNTER — Ambulatory Visit: Payer: Medicare Other

## 2023-05-18 ENCOUNTER — Ambulatory Visit (HOSPITAL_COMMUNITY): Payer: Medicare Other

## 2023-07-03 ENCOUNTER — Other Ambulatory Visit: Payer: Self-pay | Admitting: Family Medicine

## 2023-07-03 DIAGNOSIS — M5416 Radiculopathy, lumbar region: Secondary | ICD-10-CM

## 2023-07-20 ENCOUNTER — Other Ambulatory Visit: Payer: Medicare Other

## 2024-02-01 ENCOUNTER — Other Ambulatory Visit: Payer: Self-pay | Admitting: Internal Medicine

## 2024-02-01 ENCOUNTER — Encounter: Payer: Self-pay | Admitting: Internal Medicine

## 2024-02-01 DIAGNOSIS — Z1231 Encounter for screening mammogram for malignant neoplasm of breast: Secondary | ICD-10-CM

## 2024-03-04 ENCOUNTER — Ambulatory Visit
Admission: RE | Admit: 2024-03-04 | Discharge: 2024-03-04 | Disposition: A | Discharge status: Home / Self Care | Source: Ambulatory Visit | Attending: Internal Medicine | Admitting: Internal Medicine

## 2024-03-04 DIAGNOSIS — Z1231 Encounter for screening mammogram for malignant neoplasm of breast: Secondary | ICD-10-CM | POA: Diagnosis present

## 2024-04-19 ENCOUNTER — Other Ambulatory Visit: Payer: Self-pay

## 2024-04-19 DIAGNOSIS — R0989 Other specified symptoms and signs involving the circulatory and respiratory systems: Secondary | ICD-10-CM

## 2024-04-20 ENCOUNTER — Other Ambulatory Visit: Payer: Self-pay

## 2024-04-20 DIAGNOSIS — R0989 Other specified symptoms and signs involving the circulatory and respiratory systems: Secondary | ICD-10-CM

## 2024-04-22 ENCOUNTER — Ambulatory Visit

## 2024-04-28 ENCOUNTER — Other Ambulatory Visit

## 2024-04-29 ENCOUNTER — Other Ambulatory Visit: Payer: Self-pay | Admitting: Surgery

## 2024-04-29 ENCOUNTER — Other Ambulatory Visit

## 2024-04-29 ENCOUNTER — Ambulatory Visit
Admission: RE | Admit: 2024-04-29 | Discharge: 2024-04-29 | Disposition: A | Discharge status: Home / Self Care | Source: Ambulatory Visit

## 2024-04-29 DIAGNOSIS — R0989 Other specified symptoms and signs involving the circulatory and respiratory systems: Secondary | ICD-10-CM

## 2024-05-10 ENCOUNTER — Ambulatory Visit: Admission: RE | Admit: 2024-05-10 | Discharge: 2024-05-10 | Disposition: A | Source: Ambulatory Visit

## 2024-05-10 DIAGNOSIS — R0989 Other specified symptoms and signs involving the circulatory and respiratory systems: Secondary | ICD-10-CM

## 2024-05-16 ENCOUNTER — Ambulatory Visit (INDEPENDENT_AMBULATORY_CARE_PROVIDER_SITE_OTHER): Admitting: Vascular Surgery

## 2024-05-16 ENCOUNTER — Encounter (INDEPENDENT_AMBULATORY_CARE_PROVIDER_SITE_OTHER): Payer: Self-pay | Admitting: Vascular Surgery

## 2024-05-16 VITALS — BP 136/78 | HR 64 | Resp 18 | Ht 64.0 in | Wt 150.0 lb

## 2024-05-16 DIAGNOSIS — I25118 Atherosclerotic heart disease of native coronary artery with other forms of angina pectoris: Secondary | ICD-10-CM

## 2024-05-16 DIAGNOSIS — E785 Hyperlipidemia, unspecified: Secondary | ICD-10-CM

## 2024-05-16 DIAGNOSIS — I70213 Atherosclerosis of native arteries of extremities with intermittent claudication, bilateral legs: Secondary | ICD-10-CM

## 2024-05-16 DIAGNOSIS — I6529 Occlusion and stenosis of unspecified carotid artery: Secondary | ICD-10-CM | POA: Insufficient documentation

## 2024-05-16 DIAGNOSIS — I480 Paroxysmal atrial fibrillation: Secondary | ICD-10-CM

## 2024-05-16 DIAGNOSIS — I6523 Occlusion and stenosis of bilateral carotid arteries: Secondary | ICD-10-CM | POA: Diagnosis not present

## 2024-05-16 NOTE — Progress Notes (Unsigned)
 MRN : [id number]  Donna Welch is a 78 y.o. (16-Jul-1945) female who presents with chief complaint of check carotid arteries.  History of Present Illness:   The patient is seen for evaluation of carotid stenosis. The carotid stenosis was identified after duplex ultrasound of the carotid arteries was obtained May 10, 2024.  The patient denies amaurosis fugax. There is no recent history of TIA symptoms or focal motor deficits. There is no prior documented CVA.  There is no history of migraine headaches. There is no history of seizures.  The patient is taking enteric-coated aspirin  81 mg daily.  No recent shortening of the patient's walking distance or new symptoms consistent with claudication.  No history of rest pain symptoms. No new ulcers or wounds of the lower extremities have occurred.  There is no history of DVT, PE or superficial thrombophlebitis. No recent episodes of angina or shortness of breath documented.  I have personally reviewed the ultrasound and do not agree with the radiologic interpretation.  I do not believe that the left ICA is greater than 70% peak systolic velocities are only 240 cm/s and the end-diastolic velocity is only 48 cm/s.  I believe that she has bilateral 50 to 69% ICA lesions.  No outpatient medications have been marked as taking for the 05/16/24 encounter (Appointment) with Jama, Cordella MATSU, MD.    Past Medical History:  Diagnosis Date   Allergy    GERD (gastroesophageal reflux disease)    Hyperlipidemia    Hypertension     Past Surgical History:  Procedure Laterality Date   ABDOMINAL AORTOGRAM W/LOWER EXTREMITY N/A 01/13/2023   Procedure: ABDOMINAL AORTOGRAM W/LOWER EXTREMITY;  Surgeon: Serene Gaile LELON, MD;  Location: [facility];  Service: Cardiovascular;  Laterality: N/A;   ABDOMINAL HYSTERECTOMY     COLONOSCOPY Bilateral    COLONOSCOPY WITH PROPOFOL  N/A 12/23/2021   Procedure: COLONOSCOPY WITH  PROPOFOL ;  Surgeon: Maryruth Ole DASEN, MD;  Location: [facility];  Service: Endoscopy;  Laterality: N/A;   PERIPHERAL VASCULAR INTERVENTION  01/13/2023   Procedure: PERIPHERAL VASCULAR INTERVENTION;  Surgeon: Serene Gaile LELON, MD;  Location: [facility];  Service: Cardiovascular;;    Social History Social History   Tobacco Use   Smoking status: Former    Current packs/day: 0.50    Types: Cigarettes   Smokeless tobacco: Never   Tobacco comments:    quit for a year then started back after mother died.  Vaping Use   Vaping status: Never Used  Substance Use Topics   Alcohol use: Yes    Alcohol/week: 14.0 standard drinks of alcohol    Types: 14 Standard drinks or equivalent per week   Drug use: Yes    Types: Marijuana    Family History Family History  Problem Relation Age of Onset   Diabetes Mother    Hypertension Mother    Breast cancer Neg Hx     Allergies  Allergen Reactions   Hydrocodone-Acetaminophen  Other (See Comments)    Other reaction(s): Other (See Comments) High blood pressure hyperactivity    Vicodin [Hydrocodone-Acetaminophen ] Other (See Comments)    hyperactivity     REVIEW OF SYSTEMS (Negative unless checked)  Constitutional: [] Weight loss  [] Fever  [] Chills Cardiac: [] Chest pain   [] Chest pressure   [] Palpitations   [] Shortness of breath when laying flat   [] Shortness of breath  with exertion. Vascular:  [x] Pain in legs with walking   [] Pain in legs at rest  [] History of DVT   [] Phlebitis   [] Swelling in legs   [] Varicose veins   [] Non-healing ulcers Pulmonary:   [] Uses home oxygen   [] Productive cough   [] Hemoptysis   [] Wheeze  [] COPD   [] Asthma Neurologic:  [] Dizziness   [] Seizures   [] History of stroke   [] History of TIA  [] Aphasia   [] Vissual changes   [] Weakness or numbness in arm   [] Weakness or numbness in leg Musculoskeletal:   [] Joint swelling   [] Joint pain   [] Low back pain Hematologic:  [] Easy bruising  [] Easy bleeding    [] Hypercoagulable state   [] Anemic Gastrointestinal:  [] Diarrhea   [] Vomiting  [] Gastroesophageal reflux/heartburn   [] Difficulty swallowing. Genitourinary:  [] Chronic kidney disease   [] Difficult urination  [] Frequent urination   [] Blood in urine Skin:  [] Rashes   [] Ulcers  Psychological:  [] History of anxiety   []  History of major depression.  Physical Examination  There were no vitals filed for this visit. There is no height or weight on file to calculate BMI. Gen: WD/WN, NAD Head: /AT, No temporalis wasting.  Ear/Nose/Throat: Hearing grossly intact, nares w/o erythema or drainage Eyes: PER, EOMI, sclera nonicteric.  Neck: Supple, no masses.  No bruit or JVD.  Pulmonary:  Good air movement, no audible wheezing, no use of accessory muscles.  Cardiac: RRR, normal S1, S2, no Murmurs. Vascular:  carotid bruit noted Vessel Right Left  Radial Palpable Palpable  Carotid  Palpable  Palpable  Gastrointestinal: soft, non-distended. No guarding/no peritoneal signs.  Musculoskeletal: M/S 5/5 throughout.  No visible deformity.  Neurologic: CN 2-12 intact. Pain and light touch intact in extremities.  Symmetrical.  Speech is fluent. Motor exam as listed above. Psychiatric: Judgment intact, Mood & affect appropriate for pt's clinical situation. Dermatologic: No rashes or ulcers noted.  No changes consistent with cellulitis.   CBC Lab Results  Component Value Date   WBC 6.5 05/11/2022   HGB 14.3 01/13/2023   HCT 42.0 01/13/2023   MCV 97.4 05/11/2022   PLT 246 05/11/2022    BMET    Component Value Date/Time   NA 139 01/13/2023 0728   NA 141 01/26/2015 1132   K 4.1 01/13/2023 0728   CL 104 01/13/2023 0728   CO2 27 05/11/2022 0539   GLUCOSE 98 01/13/2023 0728   BUN 8 01/13/2023 0728   BUN 13 01/26/2015 1132   CREATININE 0.90 01/13/2023 0728   CALCIUM  9.0 05/11/2022 0539   GFRNONAA 60 (L) 05/11/2022 0539   GFRAA >60 03/05/2017 1644   CrCl cannot be calculated (Patient's most  recent lab result is older than the maximum 21 days allowed.).  COAG Lab Results  Component Value Date   INR 1.1 05/10/2022    Radiology US  Carotid Bilateral Result Date: 05/10/2024 CLINICAL DATA:  TIAs, hypertension, hyperlipidemia, former smoker EXAM: BILATERAL CAROTID DUPLEX ULTRASOUND TECHNIQUE: Elnor scale imaging, color Doppler and duplex ultrasound were performed of bilateral carotid and vertebral arteries in the neck. COMPARISON:  None Available. FINDINGS: Criteria: Quantification of carotid stenosis is based on velocity parameters that correlate the residual internal carotid diameter with NASCET-based stenosis levels, using the diameter of the distal internal carotid lumen as the denominator for stenosis measurement. The following velocity measurements were obtained: RIGHT ICA: 226/64 cm/sec CCA: 65/19 cm/sec SYSTOLIC ICA/CCA RATIO:  3.5 ECA: 48 cm/sec LEFT ICA: 247/48 cm/sec CCA: 72/24 cm/sec SYSTOLIC ICA/CCA RATIO:  3.4 ECA: 90 cm/sec  RIGHT CAROTID ARTERY: Mixed echogenicity heterogeneous bifurcation atherosclerosis. elevated right ICA velocity at 226/64 centimeters/second with turbulent flow/spectral broadening. Right ICA stenosis estimated at 50-69% by ultrasound criteria. RIGHT VERTEBRAL ARTERY:  Normal antegrade flow LEFT CAROTID ARTERY: Similar mixed echogenicity partially calcified left carotid system atherosclerosis slightly worse. Left ICA elevated velocity at 247/48 centimeters/second with turbulent flow/spectral broadening. Left ICA stenosis estimated at greater than 70% by ultrasound criteria. LEFT VERTEBRAL ARTERY:  Normal antegrade flow Upper extremity blood pressures: RIGHT: 137/77 LEFT: 138/79 IMPRESSION: 1. Right ICA stenosis estimated at 50-69%. 2. Left ICA stenosis estimated at greater than 70%. 3. Normal antegrade vertebral flow bilaterally. Electronically Signed   By: CHRISTELLA.  Shick M.D.   On: 05/10/2024 13:38   MR BRAIN WO CONTRAST Result Date: 04/29/2024 EXAM: MRI BRAIN  WITHOUT CONTRAST 04/29/2024 12:24:24 PM TECHNIQUE: Multiplanar multisequence MRI of the head/brain was performed without the administration of intravenous contrast. COMPARISON: None available. CLINICAL HISTORY: Suspected transient ischemic attack R09.89 (ICD-10-CM). Episode of sudden inability to speak lasting about 10 minutes, suspicious for TIA. Family history of heart disease and stroke. Differential includes low blood sugar, but unlikely due to non-diabetic status. FINDINGS: BRAIN AND VENTRICLES: There are numerous small foci of questionable restricted diffusion present within the corona radiata and centrum semiovale bilaterally. There is age-related atrophy. There are numerous foci of increased T2 signal present within the corona radiata and centrum semiovale. No acute infarct. No intracranial hemorrhage. No mass. No midline shift. No hydrocephalus. The sella is unremarkable. Normal flow voids. ORBITS: The patient is status post bilateral cataract surgery. SINUSES AND MASTOIDS: No acute abnormality. BONES AND SOFT TISSUES: Normal marrow signal. No acute soft tissue abnormality. IMPRESSION: 1. Numerous small foci of questionable restricted diffusion within the corona radiata and centrum semiovale bilaterally, possibly related to the suspected transient ischemic attack. 2. Age-related atrophy and moderate diffuse cerebral white matter disease . Electronically signed by: Evalene Coho MD 04/29/2024 12:53 PM EDT RP Workstation: [id number]     Assessment/Plan 1. Bilateral carotid artery stenosis (Primary) Recommend:  Given the patient's asymptomatic subcritical stenosis no further invasive testing or surgery at this time.  Duplex ultrasound shows 50-69% stenosis bilaterally.  Continue antiplatelet therapy as prescribed Continue management of CAD, HTN and Hyperlipidemia Healthy heart diet,  encouraged exercise at least 4 times per week  Follow up in 6 months with duplex ultrasound and physical exam   - VAS US  CAROTID; Future  2. Atherosclerosis of native artery of both lower extremities with intermittent claudication  Recommend:  The patient has evidence of atherosclerosis of the lower extremities with claudication.  The patient does not voice lifestyle limiting changes at this point in time.  Noninvasive studies do not suggest clinically significant change.  No invasive studies, angiography or surgery at this time The patient should continue walking and begin a more formal exercise program.  The patient should continue antiplatelet therapy and aggressive treatment of the lipid abnormalities  No changes in the patient's medications at this time  Continued surveillance is indicated as atherosclerosis is likely to progress with time.    The patient will continue follow up with noninvasive studies as ordered.   3. Atherosclerosis of native coronary artery of native heart with stable angina pectoris Continue cardiac and antihypertensive medications as already ordered and reviewed, no changes at this time.  Continue statin as ordered and reviewed, no changes at this time  Nitrates PRN for chest pain  4. Paroxysmal atrial fibrillation (HCC) Continue antiarrhythmia medications as already ordered, these  medications have been reviewed and there are no changes at this time.  Continue anticoagulation as ordered by Cardiology Service  5. Hyperlipidemia, mild Continue statin as ordered and reviewed, no changes at this time    Cordella Shawl, MD  05/16/2024 12:35 PM

## 2024-11-17 ENCOUNTER — Ambulatory Visit (INDEPENDENT_AMBULATORY_CARE_PROVIDER_SITE_OTHER): Admitting: Vascular Surgery

## 2024-11-17 ENCOUNTER — Encounter (INDEPENDENT_AMBULATORY_CARE_PROVIDER_SITE_OTHER)
